# Patient Record
Sex: Female | Born: 1985 | Race: White | Hispanic: No | Marital: Single | Smoking: Current every day smoker
Health system: Southern US, Community
[De-identification: ages and names within clinical notes are randomized; demographics above are authoritative.]

## PROBLEM LIST (undated history)

## (undated) DIAGNOSIS — Z789 Other specified health status: Secondary | ICD-10-CM

## (undated) HISTORY — PX: NO PAST SURGERIES: SHX2092

---
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
## 2009-07-24 ENCOUNTER — Ambulatory Visit (HOSPITAL_COMMUNITY): Admission: RE | Admit: 2009-07-24 | Discharge: 2009-07-24 | Payer: Self-pay | Admitting: Unknown Physician Specialty

## 2009-08-21 ENCOUNTER — Ambulatory Visit (HOSPITAL_COMMUNITY): Admission: RE | Admit: 2009-08-21 | Discharge: 2009-08-21 | Payer: Self-pay | Admitting: Unknown Physician Specialty

## 2010-12-08 ENCOUNTER — Encounter: Payer: Self-pay | Admitting: Internal Medicine

## 2011-04-12 ENCOUNTER — Emergency Department (HOSPITAL_COMMUNITY): Payer: Self-pay

## 2011-04-12 ENCOUNTER — Emergency Department (HOSPITAL_COMMUNITY)
Admission: EM | Admit: 2011-04-12 | Discharge: 2011-04-12 | Disposition: A | Discharge status: Home / Self Care | Payer: Self-pay | Attending: Emergency Medicine | Admitting: Emergency Medicine

## 2011-04-12 DIAGNOSIS — F3289 Other specified depressive episodes: Secondary | ICD-10-CM | POA: Insufficient documentation

## 2011-04-12 DIAGNOSIS — G43909 Migraine, unspecified, not intractable, without status migrainosus: Secondary | ICD-10-CM | POA: Insufficient documentation

## 2011-04-12 DIAGNOSIS — R109 Unspecified abdominal pain: Secondary | ICD-10-CM | POA: Insufficient documentation

## 2011-04-12 DIAGNOSIS — O99340 Other mental disorders complicating pregnancy, unspecified trimester: Secondary | ICD-10-CM | POA: Insufficient documentation

## 2011-04-12 DIAGNOSIS — O99891 Other specified diseases and conditions complicating pregnancy: Secondary | ICD-10-CM | POA: Insufficient documentation

## 2011-04-12 DIAGNOSIS — F329 Major depressive disorder, single episode, unspecified: Secondary | ICD-10-CM | POA: Insufficient documentation

## 2011-04-12 LAB — DIFFERENTIAL
Basophils Absolute: 0 10*3/uL (ref 0.0–0.1)
Eosinophils Relative: 0 % (ref 0–5)
Lymphocytes Relative: 24 % (ref 12–46)
Lymphs Abs: 2 10*3/uL (ref 0.7–4.0)
Neutro Abs: 5.8 10*3/uL (ref 1.7–7.7)
Neutrophils Relative %: 70 % (ref 43–77)

## 2011-04-12 LAB — CBC
HCT: 33.4 % — ABNORMAL LOW (ref 36.0–46.0)
Hemoglobin: 11.6 g/dL — ABNORMAL LOW (ref 12.0–15.0)
MCH: 29.5 pg (ref 26.0–34.0)
MCHC: 34.7 g/dL (ref 30.0–36.0)
MCV: 85 fL (ref 78.0–100.0)
RBC: 3.93 MIL/uL (ref 3.87–5.11)

## 2011-04-12 LAB — BASIC METABOLIC PANEL
BUN: 7 mg/dL (ref 6–23)
CO2: 26 mEq/L (ref 19–32)
Creatinine, Ser: 0.53 mg/dL (ref 0.4–1.2)
Glucose, Bld: 80 mg/dL (ref 70–99)
Potassium: 3.5 mEq/L (ref 3.5–5.1)
Sodium: 136 mEq/L (ref 135–145)

## 2011-04-12 LAB — URINALYSIS, ROUTINE W REFLEX MICROSCOPIC: pH: 6.5 (ref 5.0–8.0)

## 2011-04-12 LAB — HCG, QUANTITATIVE, PREGNANCY: hCG, Beta Chain, Quant, S: 125388 m[IU]/mL — ABNORMAL HIGH (ref <5)

## 2011-04-13 IMAGING — US US OB DETAIL+14 WK
1 series · 14 of 28 positions shown · non-contrast
Comparison: none

OBSTETRICAL ULTRASOUND:
 This ultrasound was performed in The [HOSPITAL], and the [facility] report will be stored to [REDACTED] PACS.

[Series 1: us ob detail+14 wk · 14 of 90 slices shown]
[im 4/90]
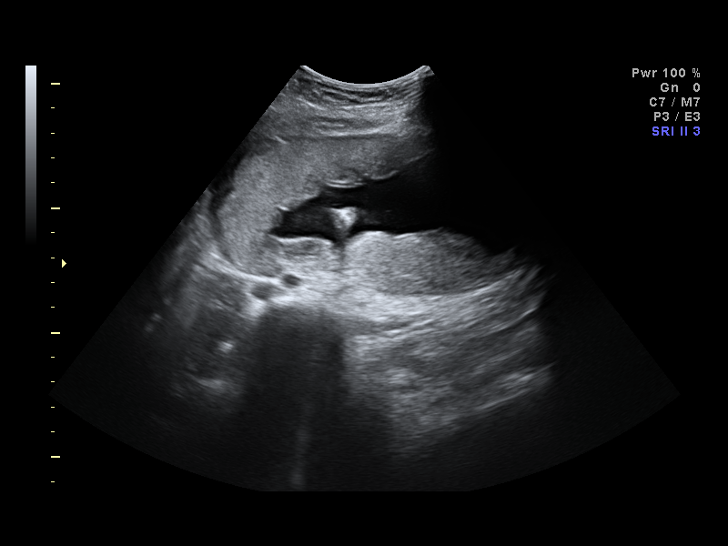
[im 10/90]
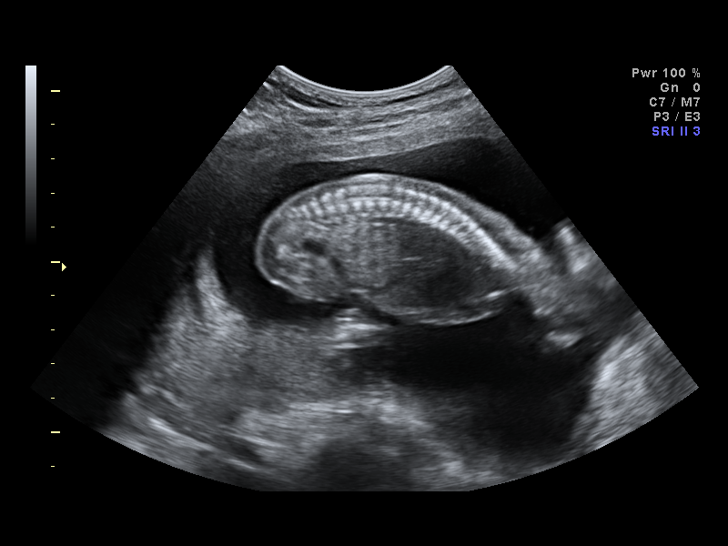
[im 17/90]
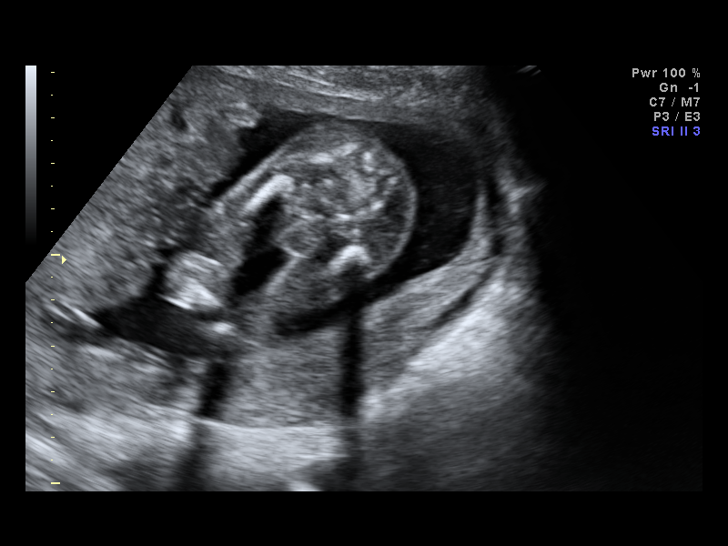
[im 24/90]
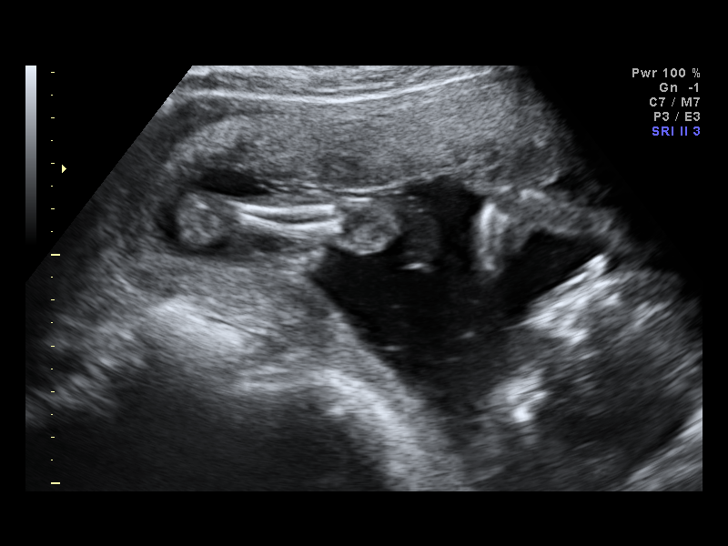
[im 30/90]
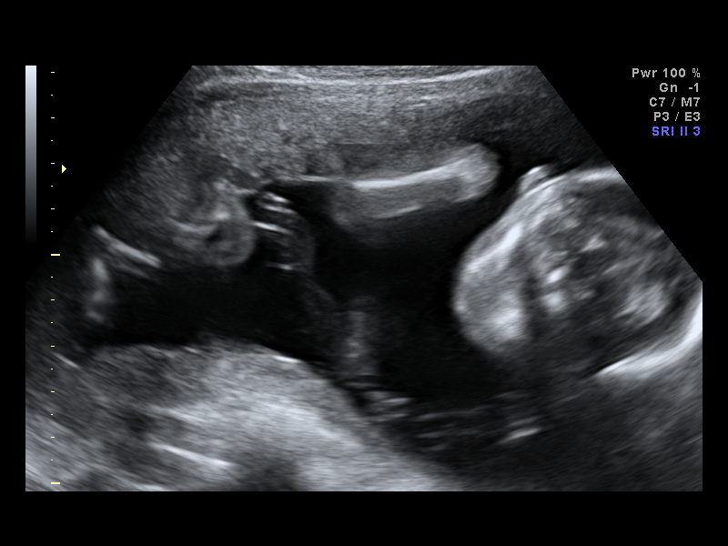
[im 37/90]
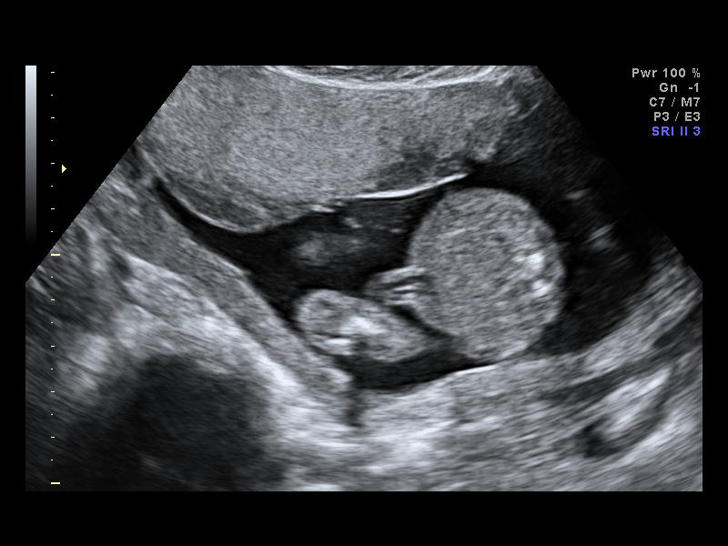
[im 43/90]
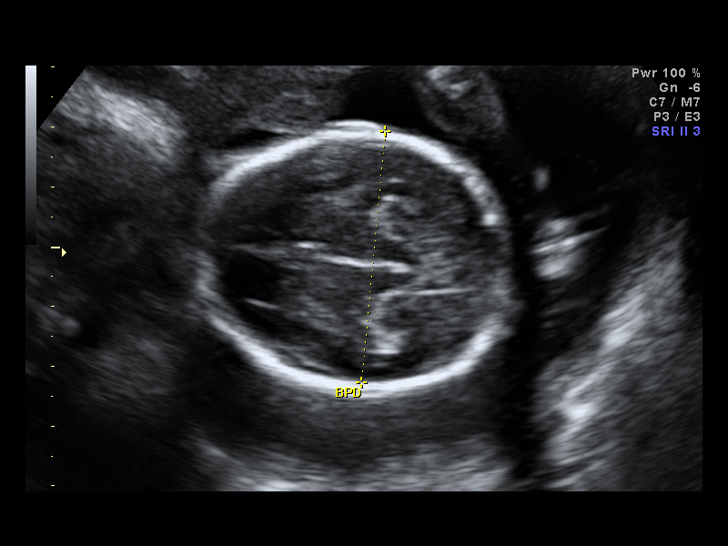
[im 50/90]
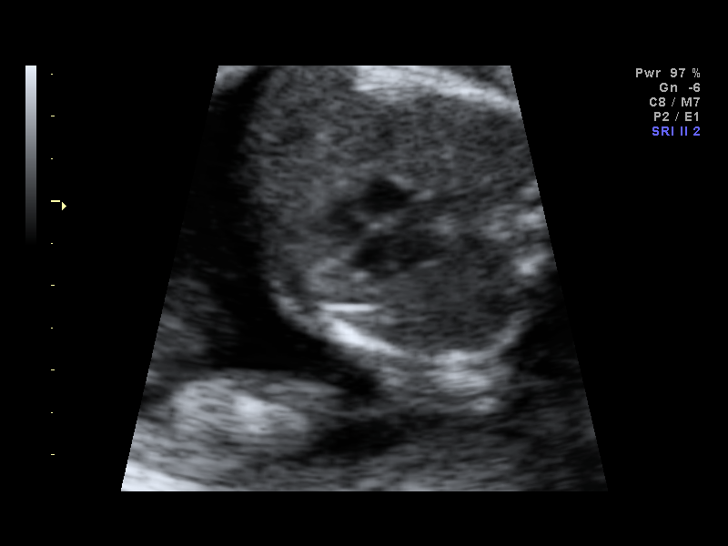
[im 57/90]
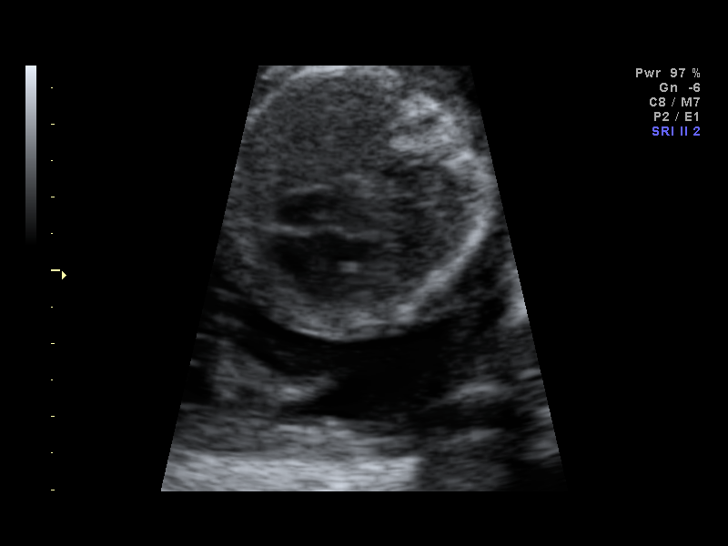
[im 63/90]
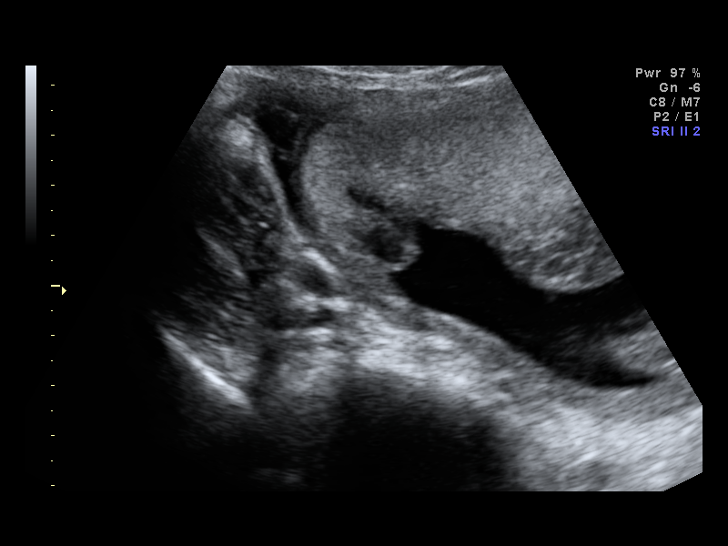
[im 70/90]
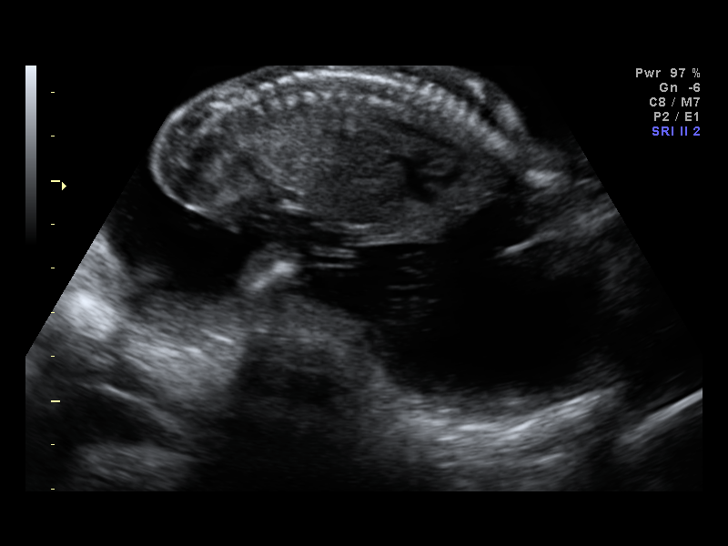
[im 76/90]
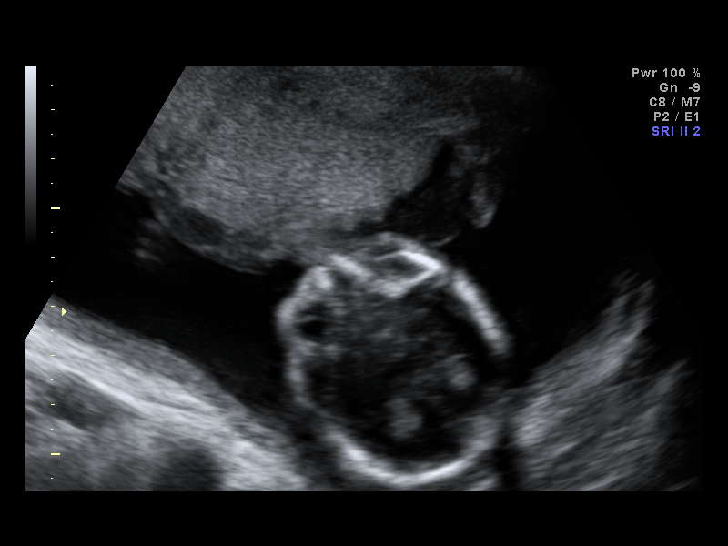
[im 83/90]
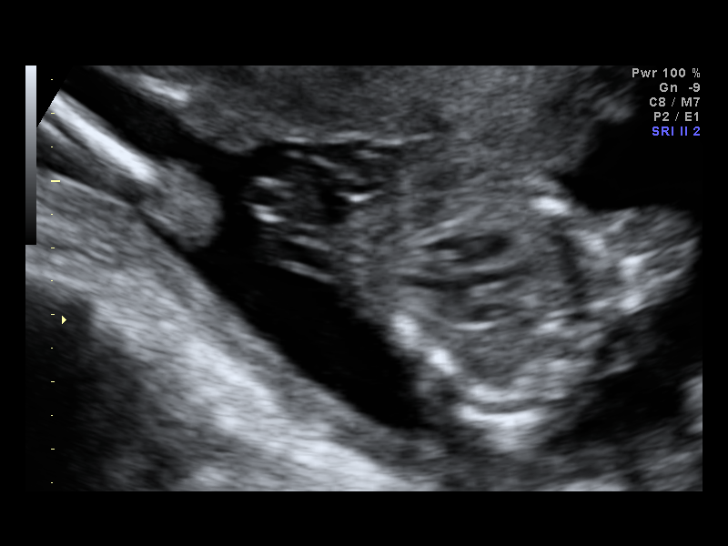
[im 90/90]
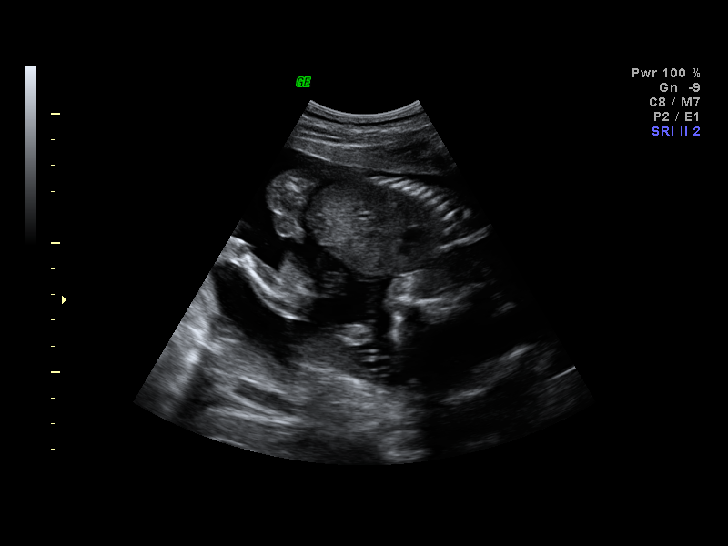

[14 of 28 positions shown; findings below may reference images not displayed]

IMPRESSION: [facility] has also been faxed to the ordering physician.

## 2011-04-15 LAB — GC/CHLAMYDIA PROBE AMP, GENITAL: GC Probe Amp, Genital: NEGATIVE

## 2011-12-10 ENCOUNTER — Ambulatory Visit (HOSPITAL_COMMUNITY)
Admission: RE | Admit: 2011-12-10 | Discharge: 2011-12-10 | Disposition: A | Discharge status: Home / Self Care | Payer: Medicaid Other | Source: Ambulatory Visit | Attending: Physician Assistant | Admitting: Physician Assistant

## 2011-12-10 ENCOUNTER — Other Ambulatory Visit (HOSPITAL_COMMUNITY): Payer: Self-pay | Admitting: Physician Assistant

## 2011-12-10 DIAGNOSIS — M542 Cervicalgia: Secondary | ICD-10-CM | POA: Insufficient documentation

## 2011-12-10 DIAGNOSIS — M549 Dorsalgia, unspecified: Secondary | ICD-10-CM

## 2011-12-10 DIAGNOSIS — M546 Pain in thoracic spine: Secondary | ICD-10-CM | POA: Insufficient documentation

## 2011-12-10 DIAGNOSIS — M545 Low back pain, unspecified: Secondary | ICD-10-CM | POA: Insufficient documentation

## 2012-01-13 ENCOUNTER — Other Ambulatory Visit (HOSPITAL_COMMUNITY): Payer: Self-pay | Admitting: Internal Medicine

## 2012-01-13 DIAGNOSIS — M549 Dorsalgia, unspecified: Secondary | ICD-10-CM

## 2012-01-15 ENCOUNTER — Ambulatory Visit (HOSPITAL_COMMUNITY)
Admission: RE | Admit: 2012-01-15 | Discharge: 2012-01-15 | Disposition: A | Discharge status: Home / Self Care | Payer: Medicaid Other | Source: Ambulatory Visit | Attending: Internal Medicine | Admitting: Internal Medicine

## 2012-01-15 DIAGNOSIS — M549 Dorsalgia, unspecified: Secondary | ICD-10-CM

## 2012-01-15 DIAGNOSIS — M25519 Pain in unspecified shoulder: Secondary | ICD-10-CM | POA: Insufficient documentation

## 2012-01-15 DIAGNOSIS — M545 Low back pain, unspecified: Secondary | ICD-10-CM | POA: Insufficient documentation

## 2012-01-15 DIAGNOSIS — M25559 Pain in unspecified hip: Secondary | ICD-10-CM | POA: Insufficient documentation

## 2012-01-15 DIAGNOSIS — M546 Pain in thoracic spine: Secondary | ICD-10-CM | POA: Insufficient documentation

## 2014-08-20 ENCOUNTER — Emergency Department (HOSPITAL_COMMUNITY): Payer: Medicaid Other

## 2014-08-20 ENCOUNTER — Inpatient Hospital Stay (HOSPITAL_COMMUNITY)
Admission: EM | Admit: 2014-08-20 | Discharge: 2014-08-20 | DRG: 603 | Discharge status: Against Medical Advice | Payer: Medicaid Other | Attending: Family Medicine | Admitting: Family Medicine

## 2014-08-20 ENCOUNTER — Encounter (HOSPITAL_COMMUNITY): Payer: Self-pay | Admitting: Emergency Medicine

## 2014-08-20 DIAGNOSIS — Z7982 Long term (current) use of aspirin: Secondary | ICD-10-CM

## 2014-08-20 DIAGNOSIS — L03211 Cellulitis of face: Principal | ICD-10-CM | POA: Diagnosis present

## 2014-08-20 DIAGNOSIS — K047 Periapical abscess without sinus: Secondary | ICD-10-CM | POA: Diagnosis present

## 2014-08-20 DIAGNOSIS — F17200 Nicotine dependence, unspecified, uncomplicated: Secondary | ICD-10-CM | POA: Diagnosis present

## 2014-08-20 DIAGNOSIS — K088 Other specified disorders of teeth and supporting structures: Secondary | ICD-10-CM

## 2014-08-20 DIAGNOSIS — K0889 Other specified disorders of teeth and supporting structures: Secondary | ICD-10-CM

## 2014-08-20 DIAGNOSIS — R6510 Systemic inflammatory response syndrome (SIRS) of non-infectious origin without acute organ dysfunction: Secondary | ICD-10-CM

## 2014-08-20 DIAGNOSIS — K1379 Other lesions of oral mucosa: Secondary | ICD-10-CM | POA: Diagnosis not present

## 2014-08-20 HISTORY — DX: Other specified health status: Z78.9

## 2014-08-20 LAB — CBC WITH DIFFERENTIAL/PLATELET
Basophils Absolute: 0 10*3/uL (ref 0.0–0.1)
Basophils Relative: 0 % (ref 0–1)
Eosinophils Absolute: 0.1 10*3/uL (ref 0.0–0.7)
Eosinophils Relative: 1 % (ref 0–5)
HCT: 39.7 % (ref 36.0–46.0)
HEMOGLOBIN: 13.3 g/dL (ref 12.0–15.0)
LYMPHS ABS: 1.8 10*3/uL (ref 0.7–4.0)
LYMPHS PCT: 14 % (ref 12–46)
MCH: 28.9 pg (ref 26.0–34.0)
MCHC: 33.5 g/dL (ref 30.0–36.0)
MCV: 86.3 fL (ref 78.0–100.0)
MONOS PCT: 7 % (ref 3–12)
Monocytes Absolute: 0.9 10*3/uL (ref 0.1–1.0)
NEUTROS PCT: 78 % — AB (ref 43–77)
Neutro Abs: 9.9 10*3/uL — ABNORMAL HIGH (ref 1.7–7.7)
PLATELETS: 247 10*3/uL (ref 150–400)
RBC: 4.6 MIL/uL (ref 3.87–5.11)
RDW: 12.7 % (ref 11.5–15.5)
WBC: 12.7 10*3/uL — AB (ref 4.0–10.5)

## 2014-08-20 LAB — BASIC METABOLIC PANEL
Anion gap: 9 (ref 5–15)
BUN: 9 mg/dL (ref 6–23)
CO2: 29 meq/L (ref 19–32)
Calcium: 9.1 mg/dL (ref 8.4–10.5)
Chloride: 99 mEq/L (ref 96–112)
Creatinine, Ser: 0.72 mg/dL (ref 0.50–1.10)
GFR calc Af Amer: >90 mL/min (ref >90)
GFR calc non Af Amer: >90 mL/min (ref >90)
GLUCOSE: 91 mg/dL (ref 70–99)
POTASSIUM: 3.8 meq/L (ref 3.7–5.3)
SODIUM: 137 meq/L (ref 137–147)

## 2014-08-20 LAB — POC URINE PREG, ED: PREG TEST UR: NEGATIVE

## 2014-08-20 MED ORDER — KETOROLAC TROMETHAMINE 30 MG/ML IJ SOLN: 30.0000 mg | Freq: Once | INTRAMUSCULAR | Status: DC

## 2014-08-20 MED ORDER — SODIUM CHLORIDE 0.9 % IJ SOLN: 3.0000 mL | Freq: Two times a day (BID) | INTRAMUSCULAR | Status: DC

## 2014-08-20 MED ORDER — IOHEXOL 300 MG/ML  SOLN
80.0000 mL | Freq: Once | INTRAMUSCULAR | Status: AC | PRN
  Administered 2014-08-20: 80 mL via INTRAVENOUS

## 2014-08-20 MED ORDER — ONDANSETRON HCL 4 MG/2ML IJ SOLN
4.0000 mg | Freq: Four times a day (QID) | INTRAMUSCULAR | Status: DC | PRN
Start: 2014-08-20 — End: 2014-08-21

## 2014-08-20 MED ORDER — CLINDAMYCIN PHOSPHATE 600 MG/50ML IV SOLN
600.0000 mg | Freq: Once | INTRAVENOUS | Status: AC
  Administered 2014-08-20: 600 mg via INTRAVENOUS
  Filled 2014-08-20: qty 50

## 2014-08-20 MED ORDER — HYDROCODONE-ACETAMINOPHEN 5-325 MG PO TABS: 1.0000 | ORAL_TABLET | ORAL | Status: DC | PRN

## 2014-08-20 MED ORDER — CLINDAMYCIN PHOSPHATE 600 MG/50ML IV SOLN
INTRAVENOUS | Status: AC
  Filled 2014-08-20: qty 50

## 2014-08-20 MED ORDER — ONDANSETRON HCL 4 MG PO TABS: 4.0000 mg | ORAL_TABLET | Freq: Four times a day (QID) | ORAL | Status: DC | PRN

## 2014-08-20 MED ORDER — SODIUM CHLORIDE 0.9 % IV SOLN: 250.0000 mL | INTRAVENOUS | Status: DC | PRN

## 2014-08-20 MED ORDER — HYDROMORPHONE HCL 1 MG/ML IJ SOLN
0.5000 mg | Freq: Once | INTRAMUSCULAR | Status: AC
  Administered 2014-08-20: 0.5 mg via INTRAVENOUS
  Filled 2014-08-20: qty 1

## 2014-08-20 MED ORDER — CLINDAMYCIN PHOSPHATE 600 MG/50ML IV SOLN
600.0000 mg | Freq: Four times a day (QID) | INTRAVENOUS | Status: DC
  Administered 2014-08-20: 600 mg via INTRAVENOUS
  Filled 2014-08-20 (×4): qty 50

## 2014-08-20 MED ORDER — MORPHINE SULFATE 2 MG/ML IJ SOLN: 2.0000 mg | INTRAMUSCULAR | Status: DC | PRN

## 2014-08-20 MED ORDER — ACETAMINOPHEN 325 MG PO TABS: 650.0000 mg | ORAL_TABLET | Freq: Four times a day (QID) | ORAL | Status: DC | PRN

## 2014-08-20 MED ORDER — SODIUM CHLORIDE 0.9 % IV SOLN
1000.0000 mL | Freq: Once | INTRAVENOUS | Status: AC
  Administered 2014-08-20: 1000 mL via INTRAVENOUS

## 2014-08-20 MED ORDER — HYDROMORPHONE HCL 1 MG/ML IJ SOLN
1.0000 mg | Freq: Once | INTRAMUSCULAR | Status: AC
  Administered 2014-08-20: 1 mg via INTRAVENOUS
  Filled 2014-08-20: qty 1

## 2014-08-20 MED ORDER — SODIUM CHLORIDE 0.9 % IJ SOLN: 3.0000 mL | INTRAMUSCULAR | Status: DC | PRN

## 2014-08-20 MED ORDER — ACETAMINOPHEN 650 MG RE SUPP: 650.0000 mg | Freq: Four times a day (QID) | RECTAL | Status: DC | PRN

## 2014-08-20 MED ORDER — ACETAMINOPHEN 325 MG PO TABS
650.0000 mg | ORAL_TABLET | Freq: Once | ORAL | Status: AC
  Administered 2014-08-20: 650 mg via ORAL
  Filled 2014-08-20: qty 2

## 2014-08-20 MED ORDER — KETOROLAC TROMETHAMINE 30 MG/ML IJ SOLN
15.0000 mg | Freq: Once | INTRAMUSCULAR | Status: AC
  Administered 2014-08-20: 15 mg via INTRAVENOUS
  Filled 2014-08-20: qty 1

## 2014-08-20 NOTE — Progress Notes (Signed)
Antibiotic completed. Pt again informed of risks for leaving AMA. Pt refusing to stay. AMA papers signed, IV removed. Mother with pt. Pt refused wheelchair, & walked out when left.

## 2014-08-20 NOTE — ED Notes (Signed)
Dental pain to left side of mouth.

## 2014-08-20 NOTE — ED Provider Notes (Signed)
CSN: [id number]     Arrival date & time 08/20/14  1046 History  This chart was scribed for Enid Skeens, MD, by Yevette Wolter, ED Scribe. This patient was seen in room APA09/APA09 and the patient's care was started at 12:03 PM.   First MD Initiated Contact with Patient 08/20/14 1155     Chief Complaint  Patient presents with  . Oral Swelling   The history is provided by the patient.   HPI Comments: Brandi Hayes is a 28 y.o. female who presents to the Emergency Department complaining of left-sided, lower facial swelling which was present upon awakening this morning. The swelling is associated with pain. The pt also presents with a fever; her temperature in the ED is 100.5 F.  She denies a h/o similar symptoms. However, she reports a tooth to the affected side has been cracked for multiple months and she has been treating the tooth with temporary fillings purchased OTC. The pt denies a h/o dental abscesses or MRSA. Additionally, she denies a cough, nausea, emesis, difficulty swallowing, or SOB.   History reviewed. No pertinent past medical history. History reviewed. No pertinent past surgical history. No family history on file. History  Substance Use Topics  . Smoking status: Current Every Day Smoker  . Smokeless tobacco: Not on file  . Alcohol Use: Not on file   No OB history provided.  Review of Systems  Constitutional: Negative for fever and chills.  HENT: Positive for dental problem and facial swelling. Negative for congestion.   Eyes: Negative for visual disturbance.  Respiratory: Negative for shortness of breath.   Cardiovascular: Negative for chest pain.  Gastrointestinal: Negative for vomiting and abdominal pain.  Genitourinary: Negative for dysuria and flank pain.  Musculoskeletal: Negative for back pain, neck pain and neck stiffness.  Skin: Negative for rash.  Neurological: Negative for light-headedness and headaches.    A complete 10 system review of systems was  obtained, and all systems were negative except where indicated in the HPI and PE.    Allergies  Amoxicillin; Penicillins; and Sulfa antibiotics  Home Medications   Prior to Admission medications   Not on File   Triage Vitals: BP 111/81  Pulse 111  Temp(Src) 100.5 F (38.1 C) (Oral)  Resp 20  Ht 5\' 5"  (1.651 m)  Wt 110 lb (49.896 kg)  BMI 18.31 kg/m2  SpO2 98%  LMP 08/13/2014  Physical Exam  Nursing note and vitals reviewed. Constitutional: She is oriented to person, place, and time. She appears well-developed and well-nourished. No distress.  Pt obviously in pain.   HENT:  Temporary filling to left lower first premolar. Significant swelling and pain on the inner and outer mucosal, lower left side. Significant swelling to submandibular on left side. No swelling of right submandibular. No induration or swelling under the tongue. Neck supple, low concern for Ludwigs  Eyes: Conjunctivae and EOM are normal.  Neck: Neck supple. No tracheal deviation present.  Adenopathy left anterior cervical.   Cardiovascular: Regular rhythm.   Tachycardic.   Pulmonary/Chest: Effort normal and breath sounds normal. No respiratory distress. She has no wheezes.  Abdominal: Soft. There is no tenderness.  Musculoskeletal: Normal range of motion. She exhibits edema.  Lymphadenopathy:    She has cervical adenopathy.  Neurological: She is alert and oriented to person, place, and time.  Skin: Skin is warm and dry.  Psychiatric: She has a normal mood and affect. Her behavior is normal.    ED Course  Procedures (including  critical care time)  DIAGNOSTIC STUDIES: Oxygen Saturation is 98% on room air, normal by my interpretation.    COORDINATION OF CARE:  12:10 PM- Discussed treatment plan with patient, and the patient agreed to the plan.   Labs Review Labs Reviewed  CBC WITH DIFFERENTIAL - Abnormal; Notable for the following:    WBC 12.7 (*)    Neutrophils Relative % 78 (*)    Neutro Abs 9.9  (*)    All other components within normal limits  BASIC METABOLIC PANEL  POC URINE PREG, ED    Imaging Review Ct Soft Tissue Neck W Contrast  08/20/2014   CLINICAL DATA:  Left-sided lower facial pain and swelling present upon awakening this morning; low-grade fever semi: Chronic cracked tooth in this region being treating with over the counter temporary fillings ; initial visit  EXAM: CT NECK WITH CONTRAST  TECHNIQUE: Multidetector CT imaging of the neck was performed using the standard protocol following the bolus administration of intravenous contrast.  CONTRAST:  80mL OMNIPAQUE IOHEXOL 300 MG/ML  SOLN  COMPARISON:  None.  FINDINGS: There is considerable soft tissue swelling over the left aspect of the jawextending superiorly into the lower malar region. There is no discrete drainable abscess. The greatest degree of inflammation is associated with the left aspect of the mandible mandible. No periosteal reaction is demonstrated nor is there cortical disruption. There is lucency within the mandible adjacent to 2 lateral teeth, 1 of which contains a metallic filling. The maxilla is intact.  There is fluid within the maxillary, ethmoid, and sphenoid sinuses consistent with acute sinusitis. There is fluid within the nasal passages. The mastoid air cells are well pneumatized. The bony orbits are intact where visualized. The bony walls of the paranasal sinuses are intact. The temporomandibular joints are normal. The pterygoid plates are intact.  There is an enlarged right anterior cervical lymph node measuring 12 x 16 mm. There are other normal size to borderline enlarged cervical lymph nodes. The parotid and submandibular glands are normal. The larynx and thyroid gland are unremarkable. The pulmonary apices are clear. The cervical spine is unremarkable.  IMPRESSION: 1. Findings consistent with cellulitis of the soft tissues of the left aspect of the jaw extending into the lower malar region. The inflammatory  changes are most conspicuous along the left aspect of the mandible and may be related underlying the periodontal disease. There is no discrete drainable abscess. 2. There is acute sinusitis of the visualized paranasal sinuses with multiple air-fluid levels. This too could be related to the patient's symptoms. 3. There is no evidence of osteomyelitis.   Electronically Signed   By: David  [place]   On: 08/20/2014 13:56     EKG Interpretation None      MDM   Final diagnoses:  SIRS (systemic inflammatory response syndrome)  Facial cellulitis  Pain, dental  Sinusitis I personally performed the services described in this documentation, which was scribed in my presence. The recorded information has been reviewed and is accurate.  Patient with significant left lower facial mandibular swelling concern for cellulitis with possible abscess. Pain meds given mild improvement on recheck of her pain worsened, repeat pain medicines ordered. Blood work showed mild leukocytosis, patient low-grade fever and tachycardia. CT scan results reviewed no focal abscess to drain at this time, cellulitis and sinusitis.  IV fluid bolus given. Discussed with [facility] physician for consult and likely admission for IV antibiotics and pain control.  [facility] final recommendation pending.  Filed Vitals:  08/20/14 1430  BP: 106/87  Pulse: 106  Temp:   Resp:      Enid Skeens, MD 08/20/14 [phone]

## 2014-08-20 NOTE — H&P (Signed)
History and Physical  Brandi Hayes ZOX:[id number] DOB: August 19, 1986 DOA: 08/20/2014  Referring physician: Dr. Jodi Mourning in ED PCP: Cassell Smiles., MD   Chief Complaint: left mouth pain  HPI:  28 year old woman with h/o left tooth pain self-treated developed rapid swelling left side of face with pain. CT revealed cellulitis without abscess, associated with dental infection. Given prominent swelling, admitted for IV abx and pain control.  Patient reports left lower premolar pain for months or more, self-treated with OTC "fillings" from [employer]. Her dentist doesn't accept Medicaid and she has not seen a dentist in years.  48 hours ago she developed increased tooth and left jaw pain. Over last night had marked swelling and pain along left jaw. No fever. No dysphagia, no difficulty breathing, no neck pain, no drooling. No difficulty speaking, voice normal. No right sided pain or swelling.  In the emergency department afebrile, VSS, no hypoxia. WBC 12.7, BMP unremarkable, urine pregnancy negative. CT soft tissue neck showed cellulitis soft tissue left aspect of jaw related to underlying periodontal disease. No abscess.   Review of Systems:  Negative for fever, visual changes, sore throat, rash, new muscle aches, chest pain, SOB, dysuria, bleeding, n/v/abdominal pain.  Past Medical History  Diagnosis Date  . Medical history non-contributory   no past medical problems  Past Surgical History  Procedure Laterality Date  . No past surgeries      Social History:  reports that she has been smoking.  She does not have any smokeless tobacco history on file. She reports that she does not drink alcohol or use illicit drugs.  Allergies  Allergen Reactions  . Amoxicillin Hives  . Penicillins Hives  . Sulfa Antibiotics Hives    History reviewed. No pertinent family history. Mother with history of dental abscesses  Prior to Admission medications   Medication Sig Start Date End Date Taking?  Authorizing Provider  acetaminophen (TYLENOL) 500 MG tablet Take 1,000 mg by mouth every 6 (six) hours as needed (dental pain).   Yes Historical Provider, MD  ALPRAZolam (XANAX) 0.25 MG tablet Take 0.25 mg by mouth 2 (two) times daily as needed for anxiety.   Yes Historical Provider, MD  aspirin 81 MG chewable tablet Chew 162 mg by mouth daily as needed (dental pain).   Yes Historical Provider, MD  ibuprofen (ADVIL,MOTRIN) 200 MG tablet Take 800 mg by mouth every 6 (six) hours as needed (dental pain).   Yes Historical Provider, MD  oxyCODONE-acetaminophen (PERCOCET/ROXICET) 5-325 MG per tablet Take 1 tablet by mouth every 4 (four) hours as needed for severe pain.   Yes Historical Provider, MD   Physical Exam: Filed Vitals:   08/20/14 1500 08/20/14 1550 08/20/14 1619 08/20/14 1624  BP: 104/70  95/55   Pulse: 110  93   Temp:  98.2 F (36.8 C) 99 F (37.2 C)   TempSrc:  Oral Oral   Resp:      Height:   5\' 5"  (1.651 m)   Weight:    49.215 kg (108 lb 8 oz)  SpO2: 100%  100%    General: examined in ED. Appears anxious, in pain, non-toxic Eyes: PERRL, normal lids, irises  ENT: grossly normal hearing, lips & tongue. Right buccal mucosa unremarkable as are gums. Area below tongue appears normal without edema, O-P clear. Left lower dentition poor, filling noted. Buccal mucosa appears edematous but not erythematous. There is significant left submandibular swelling, induration and pain with palpation. There is no swelling of right submandibular area. Neck: moves easily  Cardiovascular: RRR, no m/r/g. No LE edema. Respiratory: CTA bilaterally, no w/r/r. Normal respiratory effort. Abdomen: soft, ntnd Skin: no rash or induration seen except as about Musculoskeletal: grossly normal tone BUE/BLE Psychiatric: grossly normal mood and affect, speech fluent and appropriate Neurologic: grossly non-focal.  Wt Readings from Last 3 Encounters:  08/20/14 49.215 kg (108 lb 8 oz)    Labs on Admission:    Basic Metabolic Panel:  Recent Labs Lab 08/20/14 1218  NA 137  K 3.8  CL 99  CO2 29  GLUCOSE 91  BUN 9  CREATININE 0.72  CALCIUM 9.1    CBC:  Recent Labs Lab 08/20/14 1218  WBC 12.7*  NEUTROABS 9.9*  HGB 13.3  HCT 39.7  MCV 86.3  PLT 247    Radiological Exams on Admission: Ct Soft Tissue Neck W Contrast  08/20/2014   CLINICAL DATA:  Left-sided lower facial pain and swelling present upon awakening this morning; low-grade fever semi: Chronic cracked tooth in this region being treating with over the counter temporary fillings ; initial visit  EXAM: CT NECK WITH CONTRAST  TECHNIQUE: Multidetector CT imaging of the neck was performed using the standard protocol following the bolus administration of intravenous contrast.  CONTRAST:  80mL OMNIPAQUE IOHEXOL 300 MG/ML  SOLN  COMPARISON:  None.  FINDINGS: There is considerable soft tissue swelling over the left aspect of the jawextending superiorly into the lower malar region. There is no discrete drainable abscess. The greatest degree of inflammation is associated with the left aspect of the mandible mandible. No periosteal reaction is demonstrated nor is there cortical disruption. There is lucency within the mandible adjacent to 2 lateral teeth, 1 of which contains a metallic filling. The maxilla is intact.  There is fluid within the maxillary, ethmoid, and sphenoid sinuses consistent with acute sinusitis. There is fluid within the nasal passages. The mastoid air cells are well pneumatized. The bony orbits are intact where visualized. The bony walls of the paranasal sinuses are intact. The temporomandibular joints are normal. The pterygoid plates are intact.  There is an enlarged right anterior cervical lymph node measuring 12 x 16 mm. There are other normal size to borderline enlarged cervical lymph nodes. The parotid and submandibular glands are normal. The larynx and thyroid gland are unremarkable. The pulmonary apices are clear. The  cervical spine is unremarkable.  IMPRESSION: 1. Findings consistent with cellulitis of the soft tissues of the left aspect of the jaw extending into the lower malar region. The inflammatory changes are most conspicuous along the left aspect of the mandible and may be related underlying the periodontal disease. There is no discrete drainable abscess. 2. There is acute sinusitis of the visualized paranasal sinuses with multiple air-fluid levels. This too could be related to the patient's symptoms. 3. There is no evidence of osteomyelitis.   Electronically Signed   By: David  [place]   On: 08/20/2014 13:56     Active Problems:   Facial cellulitis   Assessment/Plan 1. Cellulitis left aspect of jaw secondary to dental infection, no abscess with h/o poor dentition. Afebrile, VSS, no features to suggest Ludwig's angina or deep space infection. CT suggested acute sinusitis but patient asymptomatic. 2. Pain secondary to above.   Plan admission for IV abx and pain control. Once induration has stabilized will plan discharge to dental clinic for definitive treatment. Discussed with patient and mother that this infection will not fully resolve without dental follow-up.  Code Status: full code  DVT prophylaxis: SCDs Family Communication:  as above Disposition Plan/Anticipated LOS: admit, 2 days  Time spent: 60 minutes  Brendia Sacksaniel Shabre Kreher, MD  [facility] Pager [phone] 08/20/2014, 5:06 PM

## 2014-08-20 NOTE — ED Notes (Signed)
Pt cracked a tooth about a years ago and has been buying "temporary fillings OTC" as the tooth was bothering her but she couldn't get in to see a dentist due to her being on medicaid.  Pt states that the temporary fillings would be changed about every few months and when she tried to change it Friday pm she didn't get any relief and began having severe dental pain and also swelling.  Pt presents with low grade temp, crying in pain and swelling to left side of her face and reports that she thinks the swelling is affecting her left arm (she states that it feels a little weak).  Pt is alert and oriented, no difficulty swallowing or sob.  Swelling to side of neck also per pt

## 2014-08-20 NOTE — Progress Notes (Signed)
Patient informed nurse that she did not want to stay overnight and wanted to go home.  Patient made aware that it would be better for her to stay overnight to get IV antibiotics due to the infection, Dr. Irene LimboGoodrich made aware, he spoke with the patient on the phone about her infection and how it could get worse without IV antibiotics, patient insistent on leaving, patient was informed she would have to sign out AMA, she agreed to take the 1900 dose of IV antibiotics then leave afterwards.

## 2014-08-20 NOTE — ED Notes (Signed)
MD at bedside. 

## 2014-08-21 ENCOUNTER — Encounter (HOSPITAL_COMMUNITY): Payer: Self-pay | Admitting: Emergency Medicine

## 2014-08-21 ENCOUNTER — Emergency Department (HOSPITAL_COMMUNITY)
Admission: EM | Admit: 2014-08-21 | Discharge: 2014-08-22 | Discharge status: Against Medical Advice | Payer: Medicaid Other | Attending: Emergency Medicine | Admitting: Emergency Medicine

## 2014-08-21 DIAGNOSIS — M272 Inflammatory conditions of jaws: Secondary | ICD-10-CM | POA: Insufficient documentation

## 2014-08-21 DIAGNOSIS — Z720 Tobacco use: Secondary | ICD-10-CM | POA: Insufficient documentation

## 2014-08-21 NOTE — Progress Notes (Signed)
UR chart review completed.  

## 2014-08-21 NOTE — ED Notes (Signed)
Called for room placement no answer 

## 2014-08-21 NOTE — ED Notes (Signed)
Dental abscess rt mandible, Seen here 10/4 and admitted . Signed out AMA, Went to  Dentist and told to return for  IV antibiotics.

## 2014-08-22 NOTE — Discharge Summary (Signed)
  Physician Discharge Summary  Brandi Hayes ZOX:[id number] DOB: Oct 13, 1986 DOA: 08/20/2014  PCP: Cassell SmilesFUSCO,LAWRENCE J., MD  Admit date: 08/20/2014 Discharge date: 08/20/2014 AMA  PATIENT LEFT AMA  Discharge Diagnoses:  1. Cellulitis left jaw secondary to dental infection without evidence of abscess  HPI Patient presented with dental infection and was admitted for IV antibiotics and pain control. Several hours after admission patient insisted on going home. My recommendation was to stay for IV antibiotics. I informed patient that pain, swelling, infection and condition could worsen and become more serious if she left; she clearly understood the risks but insisted on leaving. She reported she would seek dental evaluation 10/5 in the morning.   Active Problems:   Facial cellulitis   Dental infection  Signed:  Brendia Sacksaniel Goodrich, MD [facility] 08/22/2014, 9:43 AM

## 2014-08-22 NOTE — ED Notes (Signed)
Called for room placement no answer 

## 2016-05-09 IMAGING — CT CT NECK W/ CM
3 of 4 series · 13 of 33 positions shown, 16 images · IV contrast (Omnipaque 300)
Comparison: None.

CLINICAL DATA: Left-sided lower facial pain and swelling present
upon awakening this morning; low-grade fever semi: Chronic cracked
tooth in this region being treating with over the counter temporary
fillings ; initial visit

EXAM:
CT NECK WITH CONTRAST
TECHNIQUE: Multidetector CT imaging of the neck was performed using the
standard protocol following the bolus administration of intravenous
contrast.
CONTRAST:  80mL OMNIPAQUE IOHEXOL 300 MG/ML  SOLN

[Series 2: soft tissue neck 2.0 b31s · axial · 0.48mm/px · z∈[+42,+194]mm · 5 of 115 slices shown, 7 images]
[im 20/115  soft-tissue]
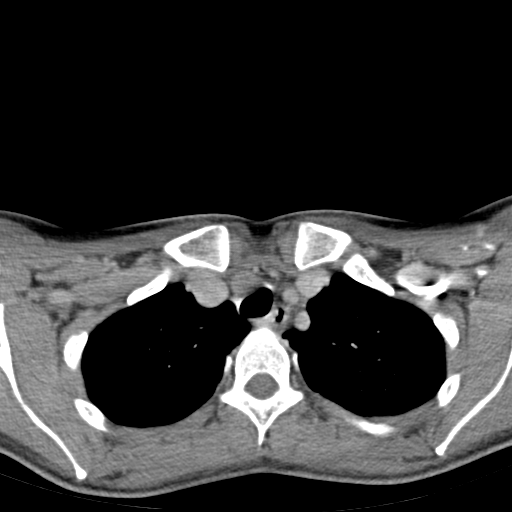
[im 20/115  bone]
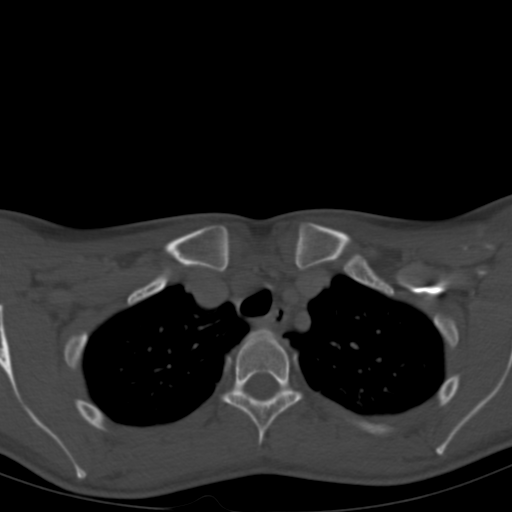
[im 39/115  bone]
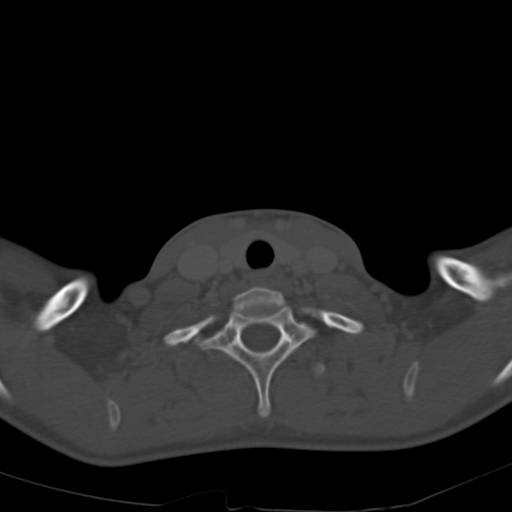
[im 58/115  bone]
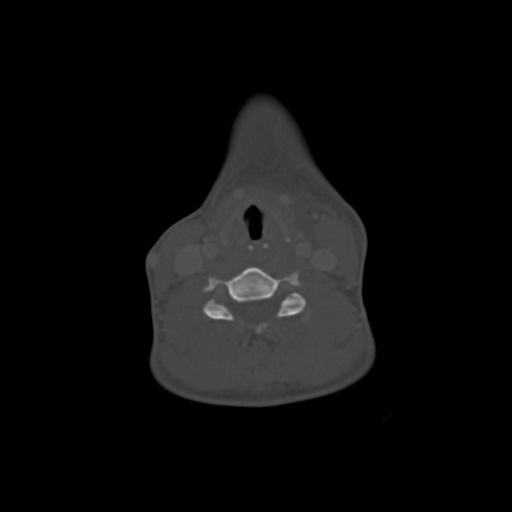
[im 77/115  bone]
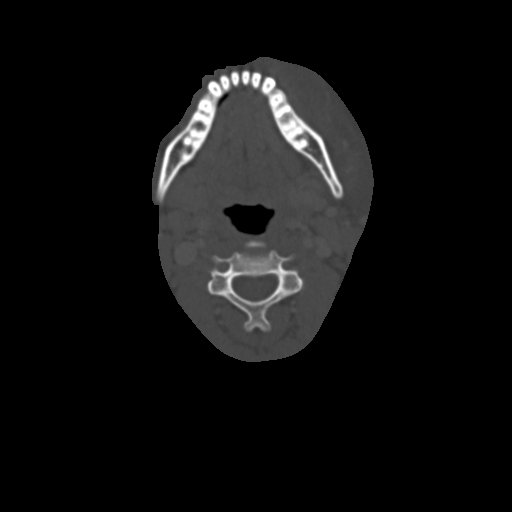
[im 96/115  soft-tissue]
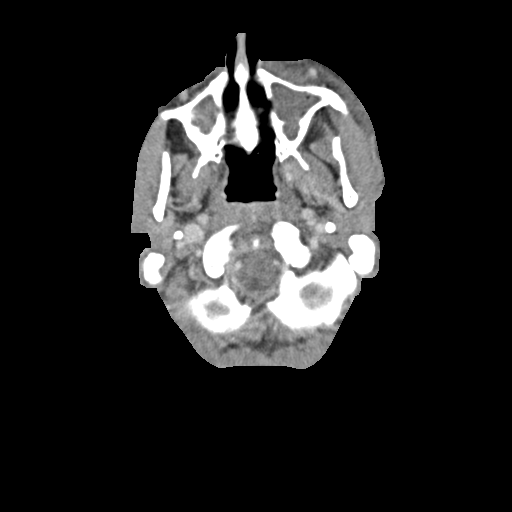
[im 96/115  bone]
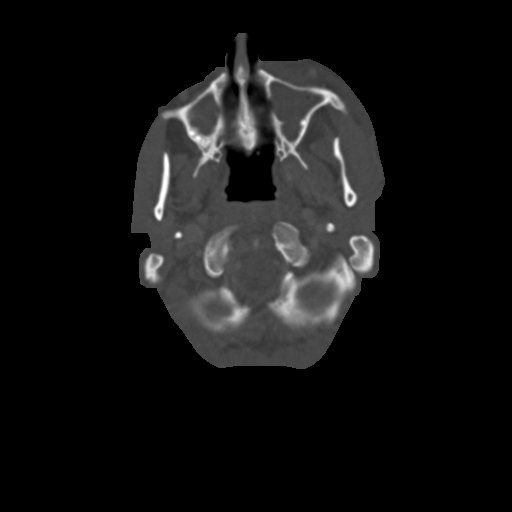

[Series 4: neck 2.0 soft tissue sag · sagittal · 0.45mm/px · 5 of 121 slices shown, 6 images]
[im 41/121  bone]
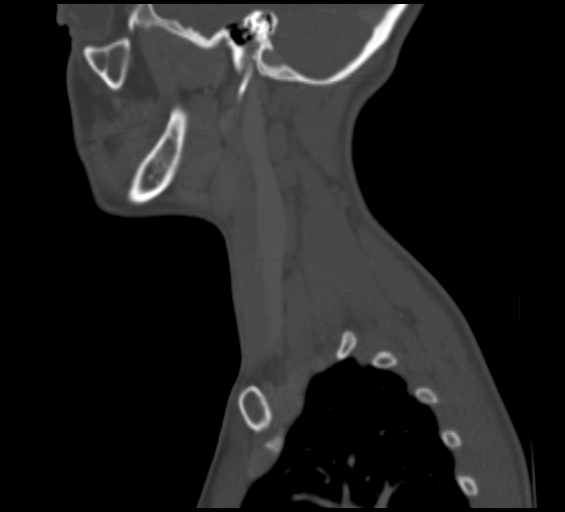
[im 51/121  bone]
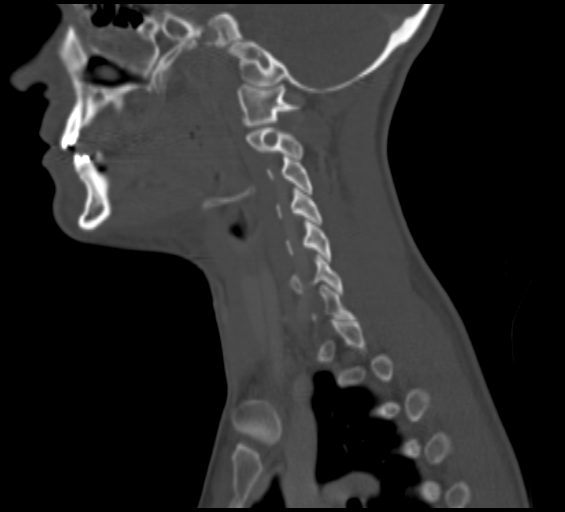
[im 61/121  soft-tissue]
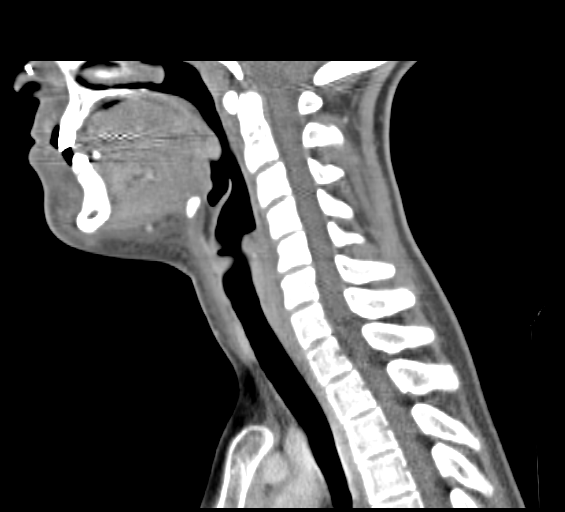
[im 61/121  bone]
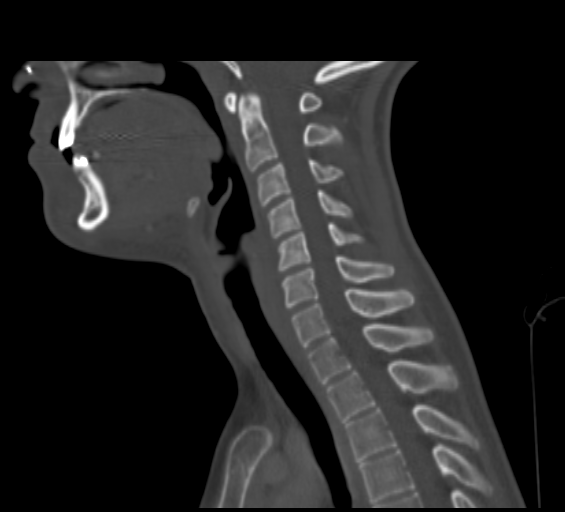
[im 71/121  bone]
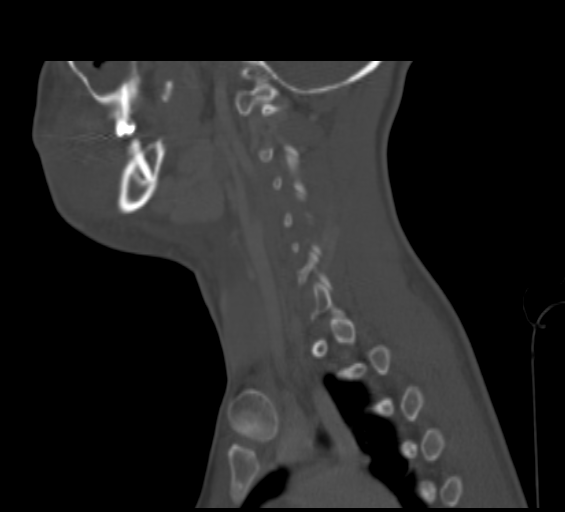
[im 81/121  bone]
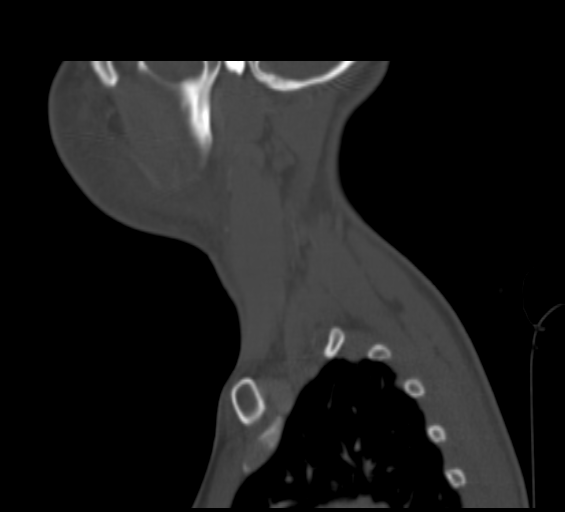

[Series 5: neck 2.0 soft tissue coro · coronal · 0.45mm/px · 3 of 117 slices shown]
[im 24/117  bone]
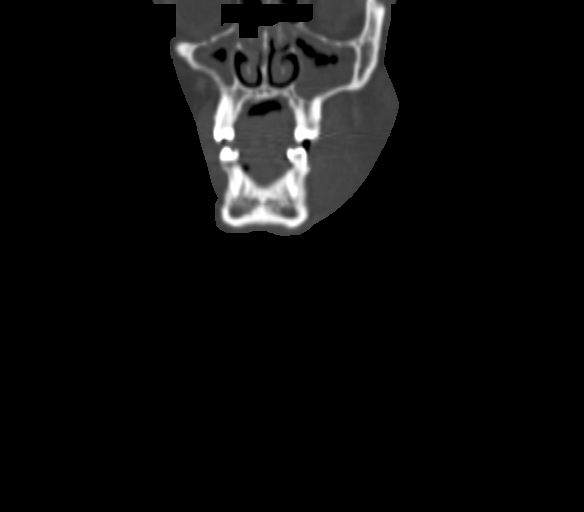
[im 47/117  bone]
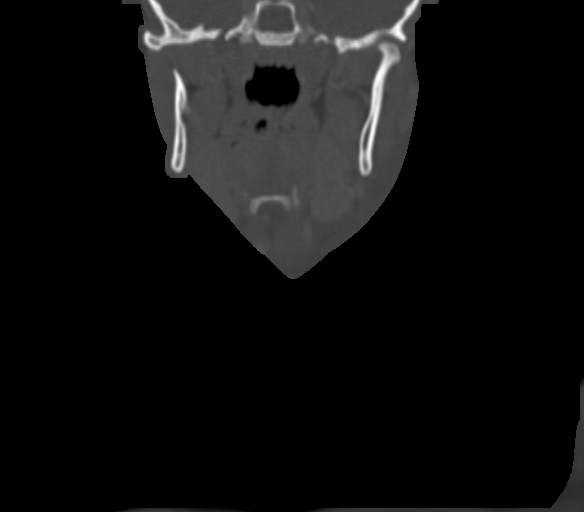
[im 70/117  bone]
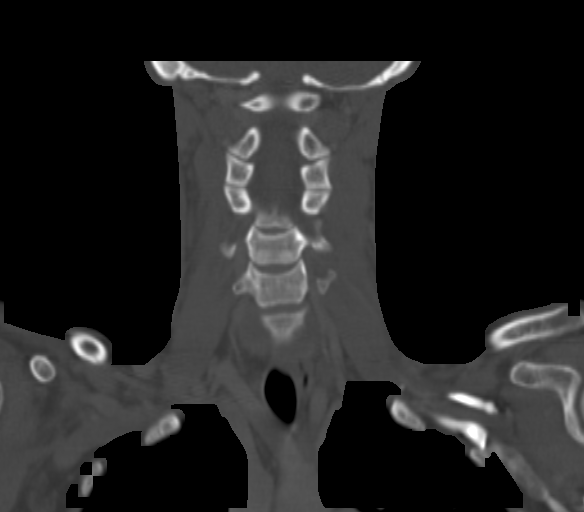

[13 of 33 positions shown; findings below may reference images not displayed]

FINDINGS: There is considerable soft tissue swelling over the left aspect of
the jawextending superiorly into the lower malar region. There is no
discrete drainable abscess. The greatest degree of inflammation is
associated with the left aspect of the mandible mandible. No
periosteal reaction is demonstrated nor is there cortical
disruption. There is lucency within the mandible adjacent to 2
lateral teeth, 1 of which contains a metallic filling. The maxilla
is intact.

There is fluid within the maxillary, ethmoid, and sphenoid sinuses
consistent with acute sinusitis. There is fluid within the nasal
passages. The mastoid air cells are well pneumatized. The bony
orbits are intact where visualized. The bony walls of the paranasal
sinuses are intact. The temporomandibular joints are normal. The
pterygoid plates are intact.

There is an enlarged right anterior cervical lymph node measuring 12
x 16 mm. There are other normal size to borderline enlarged cervical
lymph nodes. The parotid and submandibular glands are normal. The
larynx and thyroid gland are unremarkable. The pulmonary apices are
clear. The cervical spine is unremarkable.
IMPRESSION: 1. Findings consistent with cellulitis of the soft tissues of the
left aspect of the jaw extending into the lower malar region. The
inflammatory changes are most conspicuous along the left aspect of
the mandible and may be related underlying the periodontal disease.
There is no discrete drainable abscess.
2. There is acute sinusitis of the visualized paranasal sinuses with
multiple air-fluid levels. This too could be related to the
patient's symptoms.
3. There is no evidence of osteomyelitis.

## 2017-06-30 ENCOUNTER — Emergency Department (HOSPITAL_COMMUNITY)
Admission: EM | Admit: 2017-06-30 | Discharge: 2017-07-01 | Disposition: A | Discharge status: Home / Self Care | Payer: Medicaid Other | Attending: Emergency Medicine | Admitting: Emergency Medicine

## 2017-06-30 ENCOUNTER — Encounter (HOSPITAL_COMMUNITY): Payer: Self-pay | Admitting: Emergency Medicine

## 2017-06-30 DIAGNOSIS — F419 Anxiety disorder, unspecified: Secondary | ICD-10-CM | POA: Diagnosis not present

## 2017-06-30 DIAGNOSIS — F1994 Other psychoactive substance use, unspecified with psychoactive substance-induced mood disorder: Secondary | ICD-10-CM | POA: Diagnosis not present

## 2017-06-30 DIAGNOSIS — F329 Major depressive disorder, single episode, unspecified: Secondary | ICD-10-CM | POA: Insufficient documentation

## 2017-06-30 DIAGNOSIS — F1920 Other psychoactive substance dependence, uncomplicated: Secondary | ICD-10-CM | POA: Insufficient documentation

## 2017-06-30 DIAGNOSIS — F1910 Other psychoactive substance abuse, uncomplicated: Secondary | ICD-10-CM | POA: Diagnosis present

## 2017-06-30 DIAGNOSIS — F1290 Cannabis use, unspecified, uncomplicated: Secondary | ICD-10-CM | POA: Diagnosis not present

## 2017-06-30 DIAGNOSIS — F17210 Nicotine dependence, cigarettes, uncomplicated: Secondary | ICD-10-CM | POA: Diagnosis not present

## 2017-06-30 DIAGNOSIS — F32A Depression, unspecified: Secondary | ICD-10-CM

## 2017-06-30 LAB — BASIC METABOLIC PANEL
ANION GAP: 10 (ref 5–15)
BUN: 20 mg/dL (ref 6–20)
CO2: 27 mmol/L (ref 22–32)
Calcium: 9.1 mg/dL (ref 8.9–10.3)
Chloride: 98 mmol/L — ABNORMAL LOW (ref 101–111)
Creatinine, Ser: 0.87 mg/dL (ref 0.44–1.00)
GFR calc Af Amer: >60 mL/min (ref >60)
GFR calc non Af Amer: >60 mL/min (ref >60)
GLUCOSE: 65 mg/dL (ref 65–99)
Potassium: 3.2 mmol/L — ABNORMAL LOW (ref 3.5–5.1)
Sodium: 135 mmol/L (ref 135–145)

## 2017-06-30 LAB — CBC WITH DIFFERENTIAL/PLATELET
Basophils Absolute: 0.1 10*3/uL (ref 0.0–0.1)
Basophils Relative: 1 %
Eosinophils Absolute: 0.2 10*3/uL (ref 0.0–0.7)
Eosinophils Relative: 3 %
HEMATOCRIT: 42.7 % (ref 36.0–46.0)
Hemoglobin: 14.3 g/dL (ref 12.0–15.0)
LYMPHS ABS: 3.6 10*3/uL (ref 0.7–4.0)
Lymphocytes Relative: 42 %
MCH: 29.2 pg (ref 26.0–34.0)
MCHC: 33.5 g/dL (ref 30.0–36.0)
MCV: 87.3 fL (ref 78.0–100.0)
MONOS PCT: 6 %
Monocytes Absolute: 0.5 10*3/uL (ref 0.1–1.0)
NEUTROS ABS: 4.2 10*3/uL (ref 1.7–7.7)
Neutrophils Relative %: 48 %
Platelets: 342 10*3/uL (ref 150–400)
RBC: 4.89 MIL/uL (ref 3.87–5.11)
RDW: 13.5 % (ref 11.5–15.5)
WBC: 8.6 10*3/uL (ref 4.0–10.5)

## 2017-06-30 LAB — ETHANOL: Alcohol, Ethyl (B): <5 mg/dL (ref <5)

## 2017-06-30 MED ORDER — NICOTINE 21 MG/24HR TD PT24
21.0000 mg | MEDICATED_PATCH | Freq: Every day | TRANSDERMAL | Status: DC
  Administered 2017-06-30: 21 mg via TRANSDERMAL
  Filled 2017-06-30: qty 1

## 2017-06-30 MED ORDER — IBUPROFEN 400 MG PO TABS: 600.0000 mg | ORAL_TABLET | Freq: Three times a day (TID) | ORAL | Status: DC | PRN

## 2017-06-30 MED ORDER — ONDANSETRON HCL 4 MG PO TABS: 4.0000 mg | ORAL_TABLET | Freq: Three times a day (TID) | ORAL | Status: DC | PRN

## 2017-06-30 MED ORDER — ALUM & MAG HYDROXIDE-SIMETH 200-200-20 MG/5ML PO SUSP: 30.0000 mL | Freq: Four times a day (QID) | ORAL | Status: DC | PRN

## 2017-06-30 NOTE — ED Triage Notes (Signed)
Pt's mom took ivc paperwork out on pt for snorting pills and cutting herself. Pt states she does not want to hurt herself, but states if she could find a hole to crawl into to die.

## 2017-06-30 NOTE — BH Assessment (Addendum)
Tele Assessment Note   Brandi Hayes is an 31 y.o. female.  -Pt reviewed note by Dr. Alona Bene.  Brandi Hayes is a 31 y.o. female with PMH of polysubstance abuse and depression, not currently on medication, presents to the emergency department for evaluation after IVC paperwork taken out by mother. According to the paperwork the patient has been snorting Xanex and cutting herself. The patient endorses occasionally cutting but states it's for dealing with her depression and denies any suicidal intent. She mainly cuts at her right thigh. No cutting recently. She also endorses abusing Xanex but states she only uses with worsening depression. She does note some worsening depression but denies active suicidal or homicidal ideation. She does express some passive death wishes and being okay with "crawling in a hole to die." No plan to harm self currently. Denies using other drugs. No medical complaints at this time. She has not been taking her depression medication and states she needs to get up with her doctor to start taking this medicine. Denies any injection drug use.   Patient is upset that she is over at [facility] on IVC.  She does give permission to contact her mother.    Patient says that she does not want to be at the hospital.  She says that she had dropped out of communication for two days and her mother got worried.  Patient admits to using xanax (either orally or by snorting) that she is getting off the street.  She says her last use was today.  Use can vary.  Patient reports that she may use more than once per week but usually will use around once per week if she is feeling particularly depressed and anxious.  She said she may use half a blue pill all the way to 1.5 of a blue pill.  Patient says she does not want to kill herself.  She denies saying she wanted to crawl into a hole at first.  She later clarifies that she did not mean that she wanted to kill herself.  Patient denies any current or  recurrent SI, no plan or intention to kill herself.  Patient admits to making "sratches" to her legs and doing this only rarely.  She says that the last time she did this was a month ago.  She says that this is never with the intention to kill herself.  Patient denies any HI or A/V hallucinations.  Patient admits to some depression and anxiety.  She says she and boyfriend have financial problems.  He lost his job and they are staying in a motel.  She does not always have access to food.  She says her appetite is normal but she does not always have food available.  She says she is upset that she cannot just leave with boyfriend and stay back at the motel with him.  Patient has two children who she says stay with her sister.  Patient says she would not harm herself because it would harm them.  Patient says she saw a therapist one time when she was 19 after she had punched a window.  She denies any inpatient psychiatric or SA tx.  Patient says she wants to be back with her doctor and be on prescribed medications.  -Clinician discussed patient care with Donell Sievert, PA.  He recommended AM psych eval to uphold or rescind the IVC.  Diagnosis: G.A.D.; MDD recurrent moderate  Past Medical History:  Past Medical History:  Diagnosis Date  .  Medical history non-contributory     Past Surgical History:  Procedure Laterality Date  . NO PAST SURGERIES      Family History: History reviewed. No pertinent family history.  Social History:  reports that she has been smoking.  She has a 2.50 pack-year smoking history. She has never used smokeless tobacco. She reports that she drinks alcohol. She reports that she uses drugs, including Marijuana.  Additional Social History:  Alcohol / Drug Use Prescriptions: Xanax (off the street) Over the Counter: None History of alcohol / drug use?: Yes Substance #1 Name of Substance 1: Xanax (snorting or oral) 1 - Age of First Use: 31 years of age 45 - Amount  (size/oz): Will take a blue one (unknown dosage).  Usually half. 1 - Frequency: Around once per week. 1 - Duration: off and on depending on level of depression. 1 - Last Use / Amount: 08/14 took one blue pill  CIWA: CIWA-Ar BP: 105/75 Pulse Rate: 90 COWS:    PATIENT STRENGTHS: (choose at least two) Ability for insight Average or above average intelligence Capable of independent living Communication skills Motivation for treatment/growth Supportive family/friends  Allergies:  Allergies  Allergen Reactions  . Amoxicillin Hives  . Penicillins Hives  . Sulfa Antibiotics Hives    Home Medications:  (Not in a hospital admission)  OB/GYN Status:  Patient's last menstrual period was 06/30/2017.  General Assessment Data Location of Assessment: [facility] TTS Assessment: In system Is this a Tele or Face-to-Face Assessment?: Tele Assessment Is this an Initial Assessment or a Re-assessment for this encounter?: Initial Assessment Marital status: Single Is patient pregnant?: No Pregnancy Status: No Living Arrangements: Spouse/significant other (Staying w/ bf in hotel room. Pt is homeless) Can pt return to current living arrangement?: Yes Admission Status: Involuntary Is patient capable of signing voluntary admission?: No Referral Source: Self/Family/Friend (Pt's mother petitioned her.) Insurance type: MCD     Crisis Care Plan Living Arrangements: Spouse/significant other (Staying w/ bf in hotel room. Pt is homeless) Name of Psychiatrist: None Name of Therapist: None  Education Status Is patient currently in school?: No Highest grade of school patient has completed: 9th grade  Risk to self with the past 6 months Suicidal Ideation: No Has patient been a risk to self within the past 6 months prior to admission? : No Suicidal Intent: No Has patient had any suicidal intent within the past 6 months prior to admission? : No Is patient at risk for suicide?: Yes (Pt denies intention  but had made a statement about crawling ) Suicidal Plan?: No Has patient had any suicidal plan within the past 6 months prior to admission? : No Access to Means: No What has been your use of drugs/alcohol within the last 12 months?: Benzos Previous Attempts/Gestures: No How many times?:  (Pt denies) Other Self Harm Risks: None Triggers for Past Attempts: None known Intentional Self Injurious Behavior: Cutting Comment - Self Injurious Behavior: Will scratch herself rarely.  Month ago. Family Suicide History: No Recent stressful life event(s): Financial Problems, Turmoil (Comment) Persecutory voices/beliefs?: No Depression: Yes Depression Symptoms: Despondent, Isolating, Guilt, Loss of interest in usual pleasures, Feeling worthless/self pity Substance abuse history and/or treatment for substance abuse?: Yes Suicide prevention information given to non-admitted patients: Not applicable  Risk to Others within the past 6 months Homicidal Ideation: No Does patient have any lifetime risk of violence toward others beyond the six months prior to admission? : No Thoughts of Harm to Others: No Current Homicidal Intent: No  Current Homicidal Plan: No Access to Homicidal Means: No Identified Victim: No one History of harm to others?: Yes Assessment of Violence: In distant past Violent Behavior Description: Pt says years ago Does patient have access to weapons?: No Criminal Charges Pending?: No Does patient have a court date: No Is patient on probation?: No  Psychosis Hallucinations: None noted Delusions: None noted  Mental Status Report Appearance/Hygiene: Disheveled, In scrubs Eye Contact: Fair Motor Activity: Freedom of movement, Unremarkable Speech: Logical/coherent Level of Consciousness: Alert, Irritable Mood: Depressed, Anxious, Despair, Helpless, Sad Affect: Anxious, Depressed Anxiety Level: Moderate Thought Processes: Coherent, Relevant Judgement: Unable to  Assess Orientation: Person, Place, Time, Situation Obsessive Compulsive Thoughts/Behaviors: None  Cognitive Functioning Concentration: Normal Memory: Recent Impaired, Remote Intact IQ: Average Insight: Fair Impulse Control: Fair Appetite: Good Weight Loss:  (Pt has no access to food regularly.) Weight Gain: 0 Sleep: No Change Total Hours of Sleep: 10 Vegetative Symptoms: Staying in bed  ADLScreening [facility] Assessment Services) Patient's cognitive ability adequate to safely complete daily activities?: Yes Patient able to express need for assistance with ADLs?: Yes Independently performs ADLs?: Yes (appropriate for developmental age)  Prior Inpatient Therapy Prior Inpatient Therapy: No Prior Therapy Dates: None Prior Therapy Facilty/Provider(s): None Reason for Treatment: None  Prior Outpatient Therapy Prior Outpatient Therapy: No Prior Therapy Dates: N/A Prior Therapy Facilty/Provider(s): N/A Reason for Treatment: N/A Does patient have an ACCT team?: No Does patient have Intensive In-House Services?  : No Does patient have [facility] services? : No Does patient have P4CC services?: No  ADL Screening (condition at time of admission) Patient's cognitive ability adequate to safely complete daily activities?: Yes Is the patient deaf or have difficulty hearing?: No Does the patient have difficulty seeing, even when wearing glasses/contacts?: No Does the patient have difficulty concentrating, remembering, or making decisions?: No Patient able to express need for assistance with ADLs?: Yes Does the patient have difficulty dressing or bathing?: No Independently performs ADLs?: Yes (appropriate for developmental age) Does the patient have difficulty walking or climbing stairs?: No Weakness of Legs: None Weakness of Arms/Hands: None       Abuse/Neglect Assessment (Assessment to be complete while patient is alone) Physical Abuse: Denies Verbal Abuse: Denies Sexual Abuse:  Denies Exploitation of patient/patient's resources: Denies Self-Neglect: Denies     Merchant navy officer (For Healthcare) Does Patient Have a Medical Advance Directive?: No Would patient like information on creating a medical advance directive?: No - Patient declined    Additional Information 1:1 In Past 12 Months?: No CIRT Risk: No Elopement Risk: No Does patient have medical clearance?: Yes     Disposition:  Disposition Initial Assessment Completed for this Encounter: Yes Disposition of Patient: Other dispositions Other disposition(s): Other (Comment) (To be reviewed with PA)  Beatriz Stallion Ray 06/30/2017 9:52 PM

## 2017-06-30 NOTE — ED Notes (Signed)
Patients belongings placed in EMS locker room. 

## 2017-06-30 NOTE — ED Provider Notes (Signed)
Emergency Department Provider Note   I have reviewed the triage vital signs and the nursing notes.   HISTORY  Chief Complaint V70.1   HPI Brandi Hayes is a 31 y.o. female with PMH of polysubstance abuse and depression, not currently on medication, presents to the emergency department for evaluation after IVC paperwork taken out by mother. According to the paperwork the patient has been snorting Xanex and cutting herself. The patient endorses occasionally cutting but states it's for dealing with her depression and denies any suicidal intent. She mainly cuts at her right thigh. No cutting recently. She also endorses abusing Xanex but states she only uses with worsening depression. She does note some worsening depression but denies active suicidal or homicidal ideation. She does express some passive death wishes and being okay with "crawling in a hole to die." No plan to harm self currently. Denies using other drugs. No medical complaints at this time. She has not been taking her depression medication and states she needs to get up with her doctor to start taking this medicine. Denies any injection drug use.    Past Medical History:  Diagnosis Date  . Medical history non-contributory     Patient Active Problem List   Diagnosis Date Noted  . Facial cellulitis 08/20/2014  . Dental infection 08/20/2014    Past Surgical History:  Procedure Laterality Date  . NO PAST SURGERIES      Current Outpatient Rx  . Order #: [id number] Class: Historical Med  . Order #: [id number] Class: Historical Med  . Order #: [id number] Class: Historical Med  . Order #: [id number] Class: Historical Med  . Order #: [id number] Class: Historical Med    Allergies Amoxicillin; Penicillins; and Sulfa antibiotics  History reviewed. No pertinent family history.  Social History Social History  Substance Use Topics  . Smoking status: Current Every Day Smoker    Packs/day: 0.25    Years: 10.00  . Smokeless  tobacco: Never Used     Comment: Working on quitting  . Alcohol use Yes    Review of Systems  Constitutional: No fever/chills Eyes: No visual changes. ENT: No sore throat. Cardiovascular: Denies chest pain. Respiratory: Denies shortness of breath. Gastrointestinal: No abdominal pain.  No nausea, no vomiting.  No diarrhea.  No constipation. Genitourinary: Negative for dysuria. Musculoskeletal: Negative for back pain. Skin: Negative for rash. Neurological: Negative for headaches, focal weakness or numbness. Psychiatric:"severe" depression.   10-point ROS otherwise negative.  ____________________________________________   PHYSICAL EXAM:  VITAL SIGNS: ED Triage Vitals  Enc Vitals Group     BP 06/30/17 2003 105/75     Pulse Rate 06/30/17 2003 90     Resp 06/30/17 2003 15     Temp 06/30/17 2003 98.4 F (36.9 C)     Temp Source 06/30/17 2003 Oral     SpO2 06/30/17 2003 97 %     Weight 06/30/17 2004 100 lb (45.4 kg)     Height 06/30/17 2004 5\' 5"  (1.651 m)   Constitutional: Alert and oriented. Well appearing and in no acute distress. Eyes: Conjunctivae are normal.  Head: Atraumatic. Nose: No congestion/rhinnorhea. Mouth/Throat: Mucous membranes are moist.  Oropharynx non-erythematous. Neck: No stridor.   Cardiovascular: Normal rate, regular rhythm. Good peripheral circulation. Grossly normal heart sounds.   Respiratory: Normal respiratory effort.  No retractions. Lungs CTAB. Gastrointestinal: Soft and nontender. No distention.  Musculoskeletal: No lower extremity tenderness nor edema. No gross deformities of extremities. Neurologic:  Normal speech and language. No gross focal neurologic deficits are  appreciated.  Skin:  Skin is warm, dry and intact. Several healing linear abrasions to the right thigh.   ____________________________________________   LABS (all labs ordered are listed, but only abnormal results are displayed)  Labs Reviewed  BASIC METABOLIC PANEL -  Abnormal; Notable for the following:       Result Value   Potassium 3.2 (*)    Chloride 98 (*)    All other components within normal limits  CBC WITH DIFFERENTIAL/PLATELET  ETHANOL  URINALYSIS, ROUTINE W REFLEX MICROSCOPIC  PREGNANCY, URINE  RAPID URINE DRUG SCREEN, HOSP PERFORMED    ____________________________________________  RADIOLOGY  None ____________________________________________   PROCEDURES  Procedure(s) performed:   Procedures  None ____________________________________________   INITIAL IMPRESSION / ASSESSMENT AND PLAN / ED COURSE  Pertinent labs & imaging results that were available during my care of the patient were reviewed by me and considered in my medical decision making (see chart for details).  Patient presents to the emergency department under IVC filled out by parents for concern for self-harm and drug abuse. Patient denies active suicidal ideation but does endorse some passive death wishes. No medical complaints. She has several linear abrasions to the right thigh but nothing that appears fresh or requiring suture. Plan for labs and medical clearance. Plan to ask TTS for evaluation. I have completed and filed 1st exam IVC paperwork to uphold IVC at this time. Patient is medically clear for evaluation.    ____________________________________________  FINAL CLINICAL IMPRESSION(S) / ED DIAGNOSES  Final diagnoses:  Polysubstance abuse  Depression, unspecified depression type     MEDICATIONS GIVEN DURING THIS VISIT:  Medications  ibuprofen (ADVIL,MOTRIN) tablet 600 mg (not administered)  ondansetron (ZOFRAN) tablet 4 mg (not administered)  alum & mag hydroxide-simeth (MAALOX/MYLANTA) 200-200-20 MG/5ML suspension 30 mL (not administered)  nicotine (NICODERM CQ - dosed in mg/24 hours) patch 21 mg (not administered)     NEW OUTPATIENT MEDICATIONS STARTED DURING THIS VISIT:  None   Note:  This document was prepared using Dragon voice  recognition software and may include unintentional dictation errors.  Alona Bene, MD Emergency Medicine    Long, Arlyss Repress, MD 06/30/17 2202

## 2017-07-01 DIAGNOSIS — F1910 Other psychoactive substance abuse, uncomplicated: Secondary | ICD-10-CM | POA: Diagnosis present

## 2017-07-01 DIAGNOSIS — G4700 Insomnia, unspecified: Secondary | ICD-10-CM

## 2017-07-01 DIAGNOSIS — F1994 Other psychoactive substance use, unspecified with psychoactive substance-induced mood disorder: Secondary | ICD-10-CM | POA: Diagnosis not present

## 2017-07-01 DIAGNOSIS — F419 Anxiety disorder, unspecified: Secondary | ICD-10-CM

## 2017-07-01 DIAGNOSIS — F1290 Cannabis use, unspecified, uncomplicated: Secondary | ICD-10-CM | POA: Diagnosis not present

## 2017-07-01 DIAGNOSIS — F17210 Nicotine dependence, cigarettes, uncomplicated: Secondary | ICD-10-CM

## 2017-07-01 LAB — RAPID URINE DRUG SCREEN, HOSP PERFORMED
AMPHETAMINES: POSITIVE — AB
BARBITURATES: NOT DETECTED
Benzodiazepines: POSITIVE — AB
COCAINE: POSITIVE — AB
OPIATES: NOT DETECTED
TETRAHYDROCANNABINOL: POSITIVE — AB

## 2017-07-01 LAB — URINALYSIS, ROUTINE W REFLEX MICROSCOPIC
BILIRUBIN URINE: NEGATIVE
Glucose, UA: NEGATIVE mg/dL
Hgb urine dipstick: NEGATIVE
KETONES UR: NEGATIVE mg/dL
LEUKOCYTES UA: NEGATIVE
NITRITE: NEGATIVE
PH: 6 (ref 5.0–8.0)
Protein, ur: NEGATIVE mg/dL
Specific Gravity, Urine: 1.017 (ref 1.005–1.030)

## 2017-07-01 LAB — PREGNANCY, URINE: Preg Test, Ur: NEGATIVE

## 2017-07-01 NOTE — ED Provider Notes (Signed)
  Physical Exam  BP (!) 95/52   Pulse 72   Temp 98.2 F (36.8 C) (Tympanic)   Resp 16   Ht 5\' 5"  (1.651 m)   Wt 45.4 kg (100 lb)   LMP 06/30/2017   SpO2 98%   BMI 16.64 kg/m   Physical Exam  ED Course  Procedures  MDM [facility] Cleared for discharge. Reportedly suicidal thoughts or worse substance abuse. Will give resources and discharge.       Benjiman CorePickering, Kamerin Grumbine, MD 07/01/17 [phone]

## 2017-07-01 NOTE — ED Notes (Signed)
[phone] Zadie CleverlyKaren Jalomo- Patients Mother

## 2017-07-01 NOTE — ED Notes (Signed)
Pt has been given a breakfast tray and has eaten it. Pt is now sleeping soundly.

## 2017-07-01 NOTE — ED Notes (Signed)
Pt soundly sleeping. Will collect urine sample upon waking up.

## 2017-07-01 NOTE — Progress Notes (Signed)
Per Claudette Headonrad Withrow, DNP, patient does not meet criteria for inpatient treatment. Patient is recommended for discharge and to follow up with outpatient provider.    CSW faxed SA resources [facility] at [phone].      Jerald KiefEboni Wiley, RN notified.    Baldo DaubJolan Antonyo Hinderer MSW, LCSWA CSW Disposition [phone]

## 2017-07-01 NOTE — ED Notes (Signed)
[facility]

## 2017-07-01 NOTE — ED Notes (Signed)
Will obtain urine specimen from pt before breakfast.

## 2017-07-01 NOTE — Consult Note (Signed)
Telepsych Consultation   Reason for Consult:  Suicidal statements while intoxicated Referring Physician:  EDP Patient Identification: Brandi Hayes MRN:  [id number] Principal Diagnosis: Substance induced mood disorder [facility]) Diagnosis:   Patient Active Problem List   Diagnosis Date Noted  . Polysubstance abuse [F19.10] 07/01/2017    Priority: High  . Substance induced mood disorder ([place]) [F19.94] 07/01/2017    Priority: High  . Facial cellulitis [K56.256] 08/20/2014  . Dental infection [K04.7] 08/20/2014    Total Time spent with patient: 30 minutes  Subjective:   Brandi Hayes is a 31 y.o. female patient admitted with reports of transient suicidal statements later denied. Pt seen and chart reviewed. Pt is alert/oriented x4, calm, cooperative, and appropriate to situation. Pt denies suicidal/homicidal ideation and psychosis and does not appear to be responding to internal stimuli. Pt reports that she was high at the time (4 + substances in UDS) and that she would like to discharge to outpatient with her boyfriend and wants resources.  HPI:  I have reviewed and concur with HPI elements below, modified as follows:  "Brandi Hayes is an 31 y.o. female. -Pt reviewed note by Dr. Nanda Quinton.  Brandi Whitworth Edwardsis a 31 y.o.femalewith PMH of polysubstance abuse and depression, not currently on medication, presents to the emergency department for evaluation after IVC paperwork taken out by mother. According to the paperwork the patient has been snorting Xanex and cutting herself. The patient endorses occasionally cutting but states it's for dealing with her depression and denies any suicidal intent. She mainly cuts at her right thigh. No cutting recently. She also endorses abusing Xanex but states she only uses with worsening depression. She does note some worsening depression but denies active suicidal or homicidal ideation. She does express some passive death wishes and being okay with  "crawling in a hole to die." No plan to harm self currently. Denies using other drugs. No medical complaints at this time. She has not been taking her depression medication and states she needs to get up with her doctor to start taking this medicine. Denies any injection drug use.   Patient says that she does not want to be at the hospital.  She says that she had dropped out of communication for two days and her mother got worried.  Patient admits to using xanax (either orally or by snorting) that she is getting off the street.  She says her last use was today.  Use can vary.  Patient reports that she may use more than once per week but usually will use around once per week if she is feeling particularly depressed and anxious.  She said she may use half a blue pill all the way to 1.5 of a blue pill.  Patient says she does not want to kill herself.  She denies saying she wanted to crawl into a hole at first.  She later clarifies that she did not mean that she wanted to kill herself.  Patient denies any current or recurrent SI, no plan or intention to kill herself.  Patient admits to making "sratches" to her legs and doing this only rarely.  She says that the last time she did this was a month ago.  She says that this is never with the intention to kill herself."  Pt has been calm and cooperative in ED, denying any intent to harm herself. Seen today on 07/01/17 as above for evaluation.   Past Psychiatric History: substance abuse, depression  Risk to  Self: Suicidal Ideation: No Suicidal Intent: No Is patient at risk for suicide?: Yes (Pt denies intention but had made a statement about crawling ) Suicidal Plan?: No Access to Means: No What has been your use of drugs/alcohol within the last 12 months?: Benzos How many times?:  (Pt denies) Other Self Harm Risks: None Triggers for Past Attempts: None known Intentional Self Injurious Behavior: Cutting Comment - Self Injurious Behavior: Will scratch  herself rarely.  Month ago. Risk to Others: Homicidal Ideation: No Thoughts of Harm to Others: No Current Homicidal Intent: No Current Homicidal Plan: No Access to Homicidal Means: No Identified Victim: No one History of harm to others?: Yes Assessment of Violence: In distant past Violent Behavior Description: Pt says years ago Does patient have access to weapons?: No Criminal Charges Pending?: No Does patient have a court date: No Prior Inpatient Therapy: Prior Inpatient Therapy: No Prior Therapy Dates: None Prior Therapy Facilty/Provider(s): None Reason for Treatment: None Prior Outpatient Therapy: Prior Outpatient Therapy: No Prior Therapy Dates: N/A Prior Therapy Facilty/Provider(s): N/A Reason for Treatment: N/A Does patient have an ACCT team?: No Does patient have Intensive In-House Services?  : No Does patient have [facility] services? : No Does patient have P4CC services?: No  Past Medical History:  Past Medical History:  Diagnosis Date  . Medical history non-contributory     Past Surgical History:  Procedure Laterality Date  . NO PAST SURGERIES     Family History: History reviewed. No pertinent family history. Family Psychiatric  History: depression Social History:  History  Alcohol Use  . Yes     History  Drug Use  . Types: Marijuana    Social History   Social History  . Marital status: Single    Spouse name: N/A  . Number of children: N/A  . Years of education: N/A   Social History Main Topics  . Smoking status: Current Every Day Smoker    Packs/day: 0.25    Years: 10.00  . Smokeless tobacco: Never Used     Comment: Working on quitting  . Alcohol use Yes  . Drug use: Yes    Types: Marijuana  . Sexual activity: Not Currently    Birth control/ protection: None, Abstinence   Other Topics Concern  . None   Social History Narrative  . None   Additional Social History:    Allergies:   Allergies  Allergen Reactions  . Amoxicillin Hives   . Penicillins Hives  . Sulfa Antibiotics Hives    Labs:  Results for orders placed or performed during the hospital encounter of 06/30/17 (from the past 48 hour(s))  CBC with Differential     Status: None   Collection Time: 06/30/17  8:59 PM  Result Value Ref Range   WBC 8.6 4.0 - 10.5 K/uL   RBC 4.89 3.87 - 5.11 MIL/uL   Hemoglobin 14.3 12.0 - 15.0 g/dL   HCT 42.7 36.0 - 46.0 %   MCV 87.3 78.0 - 100.0 fL   MCH 29.2 26.0 - 34.0 pg   MCHC 33.5 30.0 - 36.0 g/dL   RDW 13.5 11.5 - 15.5 %   Platelets 342 150 - 400 K/uL   Neutrophils Relative % 48 %   Neutro Abs 4.2 1.7 - 7.7 K/uL   Lymphocytes Relative 42 %   Lymphs Abs 3.6 0.7 - 4.0 K/uL   Monocytes Relative 6 %   Monocytes Absolute 0.5 0.1 - 1.0 K/uL   Eosinophils Relative 3 %   Eosinophils  Absolute 0.2 0.0 - 0.7 K/uL   Basophils Relative 1 %   Basophils Absolute 0.1 0.0 - 0.1 K/uL  Basic metabolic panel     Status: Abnormal   Collection Time: 06/30/17  8:59 PM  Result Value Ref Range   Sodium 135 135 - 145 mmol/L   Potassium 3.2 (L) 3.5 - 5.1 mmol/L   Chloride 98 (L) 101 - 111 mmol/L   CO2 27 22 - 32 mmol/L   Glucose, Bld 65 65 - 99 mg/dL   BUN 20 6 - 20 mg/dL   Creatinine, Ser 0.87 0.44 - 1.00 mg/dL   Calcium 9.1 8.9 - 10.3 mg/dL   GFR calc non Af Amer >60 >60 mL/min   GFR calc Af Amer >60 >60 mL/min    Comment: (NOTE) The eGFR has been calculated using the CKD EPI equation. This calculation has not been validated in all clinical situations. eGFR's persistently <60 mL/min signify possible Chronic Kidney Disease.    Anion gap 10 5 - 15  Ethanol     Status: None   Collection Time: 06/30/17  8:59 PM  Result Value Ref Range   Alcohol, Ethyl (B) <5 <5 mg/dL    Comment:        LOWEST DETECTABLE LIMIT FOR SERUM ALCOHOL IS 5 mg/dL FOR MEDICAL PURPOSES ONLY   Urinalysis, Routine w reflex microscopic     Status: None   Collection Time: 07/01/17  8:15 AM  Result Value Ref Range   Color, Urine YELLOW YELLOW    APPearance CLEAR CLEAR   Specific Gravity, Urine 1.017 1.005 - 1.030   pH 6.0 5.0 - 8.0   Glucose, UA NEGATIVE NEGATIVE mg/dL   Hgb urine dipstick NEGATIVE NEGATIVE   Bilirubin Urine NEGATIVE NEGATIVE   Ketones, ur NEGATIVE NEGATIVE mg/dL   Protein, ur NEGATIVE NEGATIVE mg/dL   Nitrite NEGATIVE NEGATIVE   Leukocytes, UA NEGATIVE NEGATIVE  Pregnancy, urine     Status: None   Collection Time: 07/01/17  8:15 AM  Result Value Ref Range   Preg Test, Ur NEGATIVE NEGATIVE  Rapid urine drug screen (hospital performed)     Status: Abnormal   Collection Time: 07/01/17  8:15 AM  Result Value Ref Range   Opiates NONE DETECTED NONE DETECTED   Cocaine POSITIVE (A) NONE DETECTED   Benzodiazepines POSITIVE (A) NONE DETECTED   Amphetamines POSITIVE (A) NONE DETECTED   Tetrahydrocannabinol POSITIVE (A) NONE DETECTED   Barbiturates NONE DETECTED NONE DETECTED    Comment:        DRUG SCREEN FOR MEDICAL PURPOSES ONLY.  IF CONFIRMATION IS NEEDED FOR ANY PURPOSE, NOTIFY LAB WITHIN 5 DAYS.        LOWEST DETECTABLE LIMITS FOR URINE DRUG SCREEN Drug Class       Cutoff (ng/mL) Amphetamine      1000 Barbiturate      200 Benzodiazepine   270 Tricyclics       623 Opiates          300 Cocaine          300 THC              50     Current Facility-Administered Medications  Medication Dose Route Frequency Provider Last Rate Last Dose  . alum & mag hydroxide-simeth (MAALOX/MYLANTA) 200-200-20 MG/5ML suspension 30 mL  30 mL Oral Q6H PRN Long, Wonda Olds, MD      . ibuprofen (ADVIL,MOTRIN) tablet 600 mg  600 mg Oral Q8H PRN Long, Wonda Olds, MD      .  nicotine (NICODERM CQ - dosed in mg/24 hours) patch 21 mg  21 mg Transdermal Daily Long, Wonda Olds, MD   21 mg at 06/30/17 2251  . ondansetron (ZOFRAN) tablet 4 mg  4 mg Oral Q8H PRN Long, Wonda Olds, MD       Current Outpatient Prescriptions  Medication Sig Dispense Refill  . acetaminophen (TYLENOL) 500 MG tablet Take 1,000 mg by mouth every 6 (six) hours as  needed (dental pain).    Marland Kitchen ALPRAZolam (XANAX) 0.25 MG tablet Take 0.25 mg by mouth 2 (two) times daily as needed for anxiety.    Marland Kitchen aspirin 81 MG chewable tablet Chew 162 mg by mouth daily as needed (dental pain).    Marland Kitchen ibuprofen (ADVIL,MOTRIN) 200 MG tablet Take 800 mg by mouth every 6 (six) hours as needed (dental pain).    Marland Kitchen oxyCODONE-acetaminophen (PERCOCET/ROXICET) 5-325 MG per tablet Take 1 tablet by mouth every 4 (four) hours as needed for severe pain.      Musculoskeletal: UTO, camera  Psychiatric Specialty Exam: Physical Exam  Review of Systems  Psychiatric/Behavioral: Positive for depression and substance abuse. Negative for hallucinations and suicidal ideas. The patient is nervous/anxious and has insomnia.   All other systems reviewed and are negative.   Blood pressure 104/60, pulse 83, temperature 98.2 F (36.8 C), temperature source Tympanic, resp. rate 15, height 5' 5"  (1.651 m), weight 45.4 kg (100 lb), last menstrual period 06/30/2017, SpO2 97 %.Body mass index is 16.64 kg/m.  General Appearance: Disheveled  Eye Contact:  Fair  Speech:  Clear and Coherent and Normal Rate  Volume:  Normal  Mood:  Anxious and Depressed  Affect:  Appropriate, Congruent and Depressed  Thought Process:  Coherent, Goal Directed, Linear and Descriptions of Associations: Intact  Orientation:  Full (Time, Place, and Person)  Thought Content:  Focused on discharge planning  Suicidal Thoughts:  No  Homicidal Thoughts:  No  Memory:  Immediate;   Fair Recent;   Fair Remote;   Fair  Judgement:  Fair  Insight:  Fair  Psychomotor Activity:  Normal  Concentration:  Concentration: Fair and Attention Span: Fair  Recall:  [employer] of Knowledge:  Fair  Language:  Fair  Akathisia:  No  Handed:    AIMS (if indicated):     Assets:  Communication Skills Desire for Improvement Resilience Social Support  ADL's:  Intact  Cognition:  WNL  Sleep:      Treatment Plan Summary: Substance induced  mood disorder ([place]) stable for outpatient management   Disposition: No evidence of imminent risk to self or others at present.   Patient does not meet criteria for psychiatric inpatient admission. Supportive therapy provided about ongoing stressors. Refer to IOP. Discussed crisis plan, support from social network, calling 911, coming to the Emergency Department, and calling Suicide Hotline.  Benjamine Mola, FNP 07/01/2017 9:30 AM

## 2017-11-02 ENCOUNTER — Encounter: Payer: Self-pay | Admitting: Adult Health

## 2017-11-02 ENCOUNTER — Ambulatory Visit (INDEPENDENT_AMBULATORY_CARE_PROVIDER_SITE_OTHER): Payer: Medicaid Other | Admitting: Adult Health

## 2017-11-02 VITALS — BP 100/60 | HR 69 | Ht 65.0 in | Wt 103.0 lb

## 2017-11-02 DIAGNOSIS — F17200 Nicotine dependence, unspecified, uncomplicated: Secondary | ICD-10-CM

## 2017-11-02 DIAGNOSIS — Z3201 Encounter for pregnancy test, result positive: Secondary | ICD-10-CM | POA: Diagnosis not present

## 2017-11-02 DIAGNOSIS — O3680X0 Pregnancy with inconclusive fetal viability, not applicable or unspecified: Secondary | ICD-10-CM

## 2017-11-02 DIAGNOSIS — Z3A01 Less than 8 weeks gestation of pregnancy: Secondary | ICD-10-CM

## 2017-11-02 DIAGNOSIS — O26851 Spotting complicating pregnancy, first trimester: Secondary | ICD-10-CM

## 2017-11-02 DIAGNOSIS — N926 Irregular menstruation, unspecified: Secondary | ICD-10-CM | POA: Diagnosis not present

## 2017-11-02 DIAGNOSIS — Z331 Pregnant state, incidental: Secondary | ICD-10-CM | POA: Diagnosis not present

## 2017-11-02 LAB — POCT URINE PREGNANCY: PREG TEST UR: POSITIVE — AB

## 2017-11-02 NOTE — Patient Instructions (Signed)
First Trimester of Pregnancy The first trimester of pregnancy is from week 1 until the end of week 13 (months 1 through 3). A week after a sperm fertilizes an egg, the egg will implant on the wall of the uterus. This embryo will begin to develop into a baby. Genes from you and your partner will form the baby. The female genes will determine whether the baby will be a boy or a girl. At 6-8 weeks, the eyes and face will be formed, and the heartbeat can be seen on ultrasound. At the end of 12 weeks, all the baby's organs will be formed. Now that you are pregnant, you will want to do everything you can to have a healthy baby. Two of the most important things are to get good prenatal care and to follow your health care provider's instructions. Prenatal care is all the medical care you receive before the baby's birth. This care will help prevent, find, and treat any problems during the pregnancy and childbirth. Body changes during your first trimester Your body goes through many changes during pregnancy. The changes vary from woman to woman.  You may gain or lose a couple of pounds at first.  You may feel sick to your stomach (nauseous) and you may throw up (vomit). If the vomiting is uncontrollable, call your health care provider.  You may tire easily.  You may develop headaches that can be relieved by medicines. All medicines should be approved by your health care provider.  You may urinate more often. Painful urination may mean you have a bladder infection.  You may develop heartburn as a result of your pregnancy.  You may develop constipation because certain hormones are causing the muscles that push stool through your intestines to slow down.  You may develop hemorrhoids or swollen veins (varicose veins).  Your breasts may begin to grow larger and become tender. Your nipples may stick out more, and the tissue that surrounds them (areola) may become darker.  Your gums may bleed and may be  sensitive to brushing and flossing.  Dark spots or blotches (chloasma, mask of pregnancy) may develop on your face. This will likely fade after the baby is born.  Your menstrual periods will stop.  You may have a loss of appetite.  You may develop cravings for certain kinds of food.  You may have changes in your emotions from day to day, such as being excited to be pregnant or being concerned that something may go wrong with the pregnancy and baby.  You may have more vivid and strange dreams.  You may have changes in your hair. These can include thickening of your hair, rapid growth, and changes in texture. Some women also have hair loss during or after pregnancy, or hair that feels dry or thin. Your hair will most likely return to normal after your baby is born.  What to expect at prenatal visits During a routine prenatal visit:  You will be weighed to make sure you and the baby are growing normally.  Your blood pressure will be taken.  Your abdomen will be measured to track your baby's growth.  The fetal heartbeat will be listened to between weeks 10 and 14 of your pregnancy.  Test results from any previous visits will be discussed.  Your health care provider may ask you:  How you are feeling.  If you are feeling the baby move.  If you have had any abnormal symptoms, such as leaking fluid, bleeding, severe headaches,   or abdominal cramping.  If you are using any tobacco products, including cigarettes, chewing tobacco, and electronic cigarettes.  If you have any questions.  Other tests that may be performed during your first trimester include:  Blood tests to find your blood type and to check for the presence of any previous infections. The tests will also be used to check for low iron levels (anemia) and protein on red blood cells (Rh antibodies). Depending on your risk factors, or if you previously had diabetes during pregnancy, you may have tests to check for high blood  sugar that affects pregnant women (gestational diabetes).  Urine tests to check for infections, diabetes, or protein in the urine.  An ultrasound to confirm the proper growth and development of the baby.  Fetal screens for spinal cord problems (spina bifida) and Down syndrome.  HIV (human immunodeficiency virus) testing. Routine prenatal testing includes screening for HIV, unless you choose not to have this test.  You may need other tests to make sure you and the baby are doing well.  Follow these instructions at home: Medicines  Follow your health care provider's instructions regarding medicine use. Specific medicines may be either safe or unsafe to take during pregnancy.  Take a prenatal vitamin that contains at least 600 micrograms (mcg) of folic acid.  If you develop constipation, try taking a stool softener if your health care provider approves. Eating and drinking  Eat a balanced diet that includes fresh fruits and vegetables, whole grains, good sources of protein such as meat, eggs, or tofu, and low-fat dairy. Your health care provider will help you determine the amount of weight gain that is right for you.  Avoid raw meat and uncooked cheese. These carry germs that can cause birth defects in the baby.  Eating four or five small meals rather than three large meals a day may help relieve nausea and vomiting. If you start to feel nauseous, eating a few soda crackers can be helpful. Drinking liquids between meals, instead of during meals, also seems to help ease nausea and vomiting.  Limit foods that are high in fat and processed sugars, such as fried and sweet foods.  To prevent constipation: ? Eat foods that are high in fiber, such as fresh fruits and vegetables, whole grains, and beans. ? Drink enough fluid to keep your urine clear or pale yellow. Activity  Exercise only as directed by your health care provider. Most women can continue their usual exercise routine during  pregnancy. Try to exercise for 30 minutes at least 5 days a week. Exercising will help you: ? Control your weight. ? Stay in shape. ? Be prepared for labor and delivery.  Experiencing pain or cramping in the lower abdomen or lower back is a good sign that you should stop exercising. Check with your health care provider before continuing with normal exercises.  Try to avoid standing for long periods of time. Move your legs often if you must stand in one place for a long time.  Avoid heavy lifting.  Wear low-heeled shoes and practice good posture.  You may continue to have sex unless your health care provider tells you not to. Relieving pain and discomfort  Wear a good support bra to relieve breast tenderness.  Take warm sitz baths to soothe any pain or discomfort caused by hemorrhoids. Use hemorrhoid cream if your health care provider approves.  Rest with your legs elevated if you have leg cramps or low back pain.  If you develop   varicose veins in your legs, wear support hose. Elevate your feet for 15 minutes, 3-4 times a day. Limit salt in your diet. Prenatal care  Schedule your prenatal visits by the twelfth week of pregnancy. They are usually scheduled monthly at first, then more often in the last 2 months before delivery.  Write down your questions. Take them to your prenatal visits.  Keep all your prenatal visits as told by your health care provider. This is important. Safety  Wear your seat belt at all times when driving.  Make a list of emergency phone numbers, including numbers for family, friends, the hospital, and police and fire departments. General instructions  Ask your health care provider for a referral to a local prenatal education class. Begin classes no later than the beginning of month 6 of your pregnancy.  Ask for help if you have counseling or nutritional needs during pregnancy. Your health care provider can offer advice or refer you to specialists for help  with various needs.  Do not use hot tubs, steam rooms, or saunas.  Do not douche or use tampons or scented sanitary pads.  Do not cross your legs for long periods of time.  Avoid cat litter boxes and soil used by cats. These carry germs that can cause birth defects in the baby and possibly loss of the fetus by miscarriage or stillbirth.  Avoid all smoking, herbs, alcohol, and medicines not prescribed by your health care provider. Chemicals in these products affect the formation and growth of the baby.  Do not use any products that contain nicotine or tobacco, such as cigarettes and e-cigarettes. If you need help quitting, ask your health care provider. You may receive counseling support and other resources to help you quit.  Schedule a dentist appointment. At home, brush your teeth with a soft toothbrush and be gentle when you floss. Contact a health care provider if:  You have dizziness.  You have mild pelvic cramps, pelvic pressure, or nagging pain in the abdominal area.  You have persistent nausea, vomiting, or diarrhea.  You have a bad smelling vaginal discharge.  You have pain when you urinate.  You notice increased swelling in your face, hands, legs, or ankles.  You are exposed to fifth disease or chickenpox.  You are exposed to German measles (rubella) and have never had it. Get help right away if:  You have a fever.  You are leaking fluid from your vagina.  You have spotting or bleeding from your vagina.  You have severe abdominal cramping or pain.  You have rapid weight gain or loss.  You vomit blood or material that looks like coffee grounds.  You develop a severe headache.  You have shortness of breath.  You have any kind of trauma, such as from a fall or a car accident. Summary  The first trimester of pregnancy is from week 1 until the end of week 13 (months 1 through 3).  Your body goes through many changes during pregnancy. The changes vary from  woman to woman.  You will have routine prenatal visits. During those visits, your health care provider will examine you, discuss any test results you may have, and talk with you about how you are feeling. This information is not intended to replace advice given to you by your health care provider. Make sure you discuss any questions you have with your health care provider. Document Released: 10/28/2001 Document Revised: 10/15/2016 Document Reviewed: 10/15/2016 Elsevier Interactive Patient Education  2017 Elsevier   Inc.  

## 2017-11-02 NOTE — Progress Notes (Signed)
Subjective:     Patient ID: Brandi Hayes, female   DOB: May 01, 1986, 31 y.o.   MRN: [id number]  HPI Brandi Hayes is a 31 year old white female in for UPT, has missed a period and then had brown spotting.Has had 3+HPT.   Review of Systems +missed period with 3+HPT +spotting, brown ,denies any pain or nausea/vomiting Reviewed past medical,surgical, social and family history. Reviewed medications and allergies.     Objective:   Physical Exam BP 100/60 (BP Location: Left Arm, Patient Position: Sitting, Cuff Size: Normal)   Pulse 69   Ht 5\' 5"  (1.651 m)   Wt 103 lb (46.7 kg)   LMP 10/08/2017   BMI 17.14 kg/m UPT +, about 3+4 weeks by LMP with EDD 07/15/18.Skin warm and dry. Neck: mid line trachea, normal thyroid, good ROM, no lymphadenopathy noted. Lungs: clear to ausculation bilaterally. Cardiovascular: regular rate and rhythm. Abdomen is soft and non tender.Blood type O+ in CHL  PHQ 2 score 0.     Assessment:     1. Pregnancy examination or test, positive result   2. Less than [redacted] weeks gestation of pregnancy   3. Spotting affecting pregnancy in first trimester   4. Encounter to determine fetal viability of pregnancy, single or unspecified fetus   5. Smoker       Plan:     Pelvic rest Check QHCG and progesterone level Return in 2 weeks for dating US Review handouts on first trimester and by [facility]  Decrease smoking and no THC

## 2017-11-03 ENCOUNTER — Telehealth: Payer: Self-pay | Admitting: Adult Health

## 2017-11-03 LAB — BETA HCG QUANT (REF LAB): HCG QUANT: 414 m[IU]/mL

## 2017-11-03 LAB — PROGESTERONE: PROGESTERONE: 5.6 ng/mL

## 2017-11-03 NOTE — Telephone Encounter (Signed)
Left message with joey to call me.

## 2017-11-04 ENCOUNTER — Telehealth: Payer: Self-pay | Admitting: Adult Health

## 2017-11-04 DIAGNOSIS — Z3490 Encounter for supervision of normal pregnancy, unspecified, unspecified trimester: Secondary | ICD-10-CM

## 2017-11-04 NOTE — Telephone Encounter (Signed)
Pt aware that QHCG 414 and progesterone level low at 5.6,[facility]bling can supplement with progesterone if dropping then may be miscarriage

## 2017-11-04 NOTE — Telephone Encounter (Signed)
Patient called stating that she was returning Jennifer's phone call from yesterday. Please contact pt °

## 2017-11-05 ENCOUNTER — Telehealth: Payer: Self-pay | Admitting: *Deleted

## 2017-11-05 LAB — BETA HCG QUANT (REF LAB): hCG Quant: 401 m[IU]/mL

## 2017-11-05 NOTE — Telephone Encounter (Signed)
Pt informed HCG was 401 so needed it repeated next week per Victorino DikeJennifer. Pt stated she would come next Wednesday.

## 2017-11-11 ENCOUNTER — Other Ambulatory Visit: Payer: Self-pay

## 2017-11-12 ENCOUNTER — Other Ambulatory Visit: Payer: Self-pay

## 2017-11-13 ENCOUNTER — Other Ambulatory Visit: Payer: Medicaid Other

## 2017-11-14 LAB — BETA HCG QUANT (REF LAB): hCG Quant: 80 m[IU]/mL

## 2017-11-16 ENCOUNTER — Telehealth: Payer: Self-pay | Admitting: *Deleted

## 2017-11-16 NOTE — Telephone Encounter (Signed)
Patient informed HCG down to 80 and will need repeat in 1 week. Informed she could cancel or keep u/s if desired but results did look like a miscarriage.  Patient stated she wanted to keep appointment to see if there was a heartbeat.  Informed it was very likely there would not be one but could keep appointment as scheduled.  Verbalized understanding.

## 2017-11-16 NOTE — Telephone Encounter (Signed)
LMOVM to return call.

## 2017-11-19 ENCOUNTER — Other Ambulatory Visit: Payer: Medicaid Other

## 2017-11-24 ENCOUNTER — Other Ambulatory Visit: Payer: Self-pay

## 2017-11-24 ENCOUNTER — Other Ambulatory Visit: Payer: Medicaid Other

## 2017-11-25 LAB — BETA HCG QUANT (REF LAB): hCG Quant: 16 m[IU]/mL

## 2017-11-30 ENCOUNTER — Telehealth: Payer: Self-pay | Admitting: *Deleted

## 2017-11-30 NOTE — Telephone Encounter (Signed)
Informed patient she needs repeat Hcg in 2 weeks. Will come on 1/25.

## 2017-12-11 ENCOUNTER — Other Ambulatory Visit: Payer: Medicaid Other

## 2017-12-11 DIAGNOSIS — O039 Complete or unspecified spontaneous abortion without complication: Secondary | ICD-10-CM

## 2017-12-12 LAB — BETA HCG QUANT (REF LAB)

## 2018-10-19 ENCOUNTER — Ambulatory Visit: Payer: Self-pay | Admitting: Adult Health

## 2018-10-27 ENCOUNTER — Ambulatory Visit: Payer: Self-pay | Admitting: Adult Health

## 2023-05-06 ENCOUNTER — Emergency Department (HOSPITAL_COMMUNITY): Payer: Medicaid Other

## 2023-05-06 ENCOUNTER — Other Ambulatory Visit: Payer: Self-pay

## 2023-05-06 ENCOUNTER — Emergency Department (HOSPITAL_COMMUNITY)
Admission: EM | Admit: 2023-05-06 | Discharge: 2023-05-06 | Disposition: A | Discharge status: Home / Self Care | Payer: Medicaid Other | Attending: Emergency Medicine | Admitting: Emergency Medicine

## 2023-05-06 ENCOUNTER — Encounter (HOSPITAL_COMMUNITY): Payer: Self-pay | Admitting: Emergency Medicine

## 2023-05-06 DIAGNOSIS — Z3A10 10 weeks gestation of pregnancy: Secondary | ICD-10-CM | POA: Diagnosis not present

## 2023-05-06 DIAGNOSIS — O200 Threatened abortion: Secondary | ICD-10-CM | POA: Diagnosis not present

## 2023-05-06 DIAGNOSIS — O209 Hemorrhage in early pregnancy, unspecified: Secondary | ICD-10-CM | POA: Diagnosis present

## 2023-05-06 LAB — CBC
HCT: 31 % — ABNORMAL LOW (ref 36.0–46.0)
Hemoglobin: 10.1 g/dL — ABNORMAL LOW (ref 12.0–15.0)
MCH: 28.7 pg (ref 26.0–34.0)
MCHC: 32.6 g/dL (ref 30.0–36.0)
MCV: 88.1 fL (ref 80.0–100.0)
Platelets: 272 10*3/uL (ref 150–400)
RBC: 3.52 MIL/uL — ABNORMAL LOW (ref 3.87–5.11)
RDW: 12.8 % (ref 11.5–15.5)
WBC: 6.2 10*3/uL (ref 4.0–10.5)
nRBC: 0 % (ref 0.0–0.2)

## 2023-05-06 LAB — BASIC METABOLIC PANEL
Anion gap: 6 (ref 5–15)
BUN: 9 mg/dL (ref 6–20)
CO2: 24 mmol/L (ref 22–32)
Calcium: 8.4 mg/dL — ABNORMAL LOW (ref 8.9–10.3)
Chloride: 103 mmol/L (ref 98–111)
Creatinine, Ser: 0.52 mg/dL (ref 0.44–1.00)
GFR, Estimated: >60 mL/min (ref >60)
Glucose, Bld: 102 mg/dL — ABNORMAL HIGH (ref 70–99)
Potassium: 3.7 mmol/L (ref 3.5–5.1)
Sodium: 133 mmol/L — ABNORMAL LOW (ref 135–145)

## 2023-05-06 LAB — HCG, QUANTITATIVE, PREGNANCY: hCG, Beta Chain, Quant, S: >250000 m[IU]/mL — ABNORMAL HIGH (ref <5)

## 2023-05-06 LAB — TYPE AND SCREEN
ABO/RH(D): O POS
Antibody Screen: NEGATIVE

## 2023-05-06 NOTE — Discharge Instructions (Signed)
Follow-up with the GYN either at [facility] or [facility].  If you develop heavy bleeding or heavy pain then you should get seen in [place] or [facility]

## 2023-05-06 NOTE — ED Notes (Signed)
Pelvic tray is setup at bedside

## 2023-05-06 NOTE — ED Notes (Signed)
Pt returned from US

## 2023-05-06 NOTE — ED Triage Notes (Signed)
Pt via POV c/o vaginal bleeding x 10 days with clots. She took a home pregnancy test on Friday that was positive and is reporting abdominal pain. G5P3

## 2023-05-06 NOTE — ED Provider Notes (Signed)
[place] EMERGENCY DEPARTMENT AT [facility] Provider Note   CSN: [id number] Arrival date & time: 05/06/23  1522     History  Chief Complaint  Patient presents with   suspected miscarriage    MYRANDA PAVONE is a 37 y.o. female.  Patient states she is pregnant and has been having vaginal bleeding.  No significant abdominal discomfort  The history is provided by the patient and medical records. No language interpreter was used.  Vaginal Bleeding Quality:  Dark red Severity:  Moderate Onset quality:  Sudden Timing:  Constant Progression:  Waxing and waning Chronicity:  New Possible pregnancy: yes   Relieved by:  Nothing Worsened by:  Nothing Ineffective treatments:  None tried Associated symptoms: no abdominal pain, no back pain and no fatigue        Home Medications Prior to Admission medications   Medication Sig Start Date End Date Taking? Authorizing Provider  acetaminophen (TYLENOL) 500 MG tablet Take 1,000 mg by mouth every 6 (six) hours as needed (dental pain).    [provider]  Pediatric Multiple Vit-C-FA (FLINSTONES GUMMIES OMEGA-3 DHA PO) Take by mouth. Takes 2 daily    [provider]      Allergies    Amoxicillin, Penicillins, and Sulfa antibiotics    Review of Systems   Review of Systems  Constitutional:  Negative for appetite change and fatigue.  HENT:  Negative for congestion, ear discharge and sinus pressure.   Eyes:  Negative for discharge.  Respiratory:  Negative for cough.   Cardiovascular:  Negative for chest pain.  Gastrointestinal:  Negative for abdominal pain and diarrhea.  Genitourinary:  Positive for vaginal bleeding. Negative for frequency and hematuria.  Musculoskeletal:  Negative for back pain.  Skin:  Negative for rash.  Neurological:  Negative for seizures and headaches.  Psychiatric/Behavioral:  Negative for hallucinations.     Physical Exam Updated Vital Signs BP (!) 111/59 (BP Location: Right  Arm)   Pulse 81   Temp 97.6 F (36.4 C) (Oral)   Resp 14   Ht 5\' 5"  (1.651 m)   Wt 47.6 kg   LMP  (LMP Unknown)   SpO2 98%   Breastfeeding No   BMI 17.47 kg/m  Physical Exam Vitals and nursing note reviewed.  Constitutional:      Appearance: She is well-developed.  HENT:     Head: Normocephalic.     Nose: Nose normal.  Eyes:     General: No scleral icterus.    Conjunctiva/sclera: Conjunctivae normal.  Neck:     Thyroid: No thyromegaly.  Cardiovascular:     Rate and Rhythm: Normal rate and regular rhythm.     Heart sounds: No murmur heard.    No friction rub. No gallop.  Pulmonary:     Breath sounds: No stridor. No wheezing or rales.  Chest:     Chest wall: No tenderness.  Abdominal:     General: There is no distension.     Tenderness: There is no abdominal tenderness. There is no rebound.  Musculoskeletal:        General: Normal range of motion.     Cervical back: Neck supple.  Lymphadenopathy:     Cervical: No cervical adenopathy.  Skin:    Findings: No erythema or rash.  Neurological:     Mental Status: She is alert and oriented to person, place, and time.     Motor: No abnormal muscle tone.     Coordination: Coordination normal.  Psychiatric:  Behavior: Behavior normal.     ED Results / Procedures / Treatments   Labs (all labs ordered are listed, but only abnormal results are displayed) Labs Reviewed  CBC - Abnormal; Notable for the following components:      Result Value   RBC 3.52 (*)    Hemoglobin 10.1 (*)    HCT 31.0 (*)    All other components within normal limits  BASIC METABOLIC PANEL - Abnormal; Notable for the following components:   Sodium 133 (*)    Glucose, Bld 102 (*)    Calcium 8.4 (*)    All other components within normal limits  HCG, QUANTITATIVE, PREGNANCY - Abnormal; Notable for the following components:   hCG, Beta Chain, Quant, S >250,000 (*)    All other components within normal limits  TYPE AND SCREEN     EKG None  Radiology US OB Comp < 14 Wks  Result Date: 05/06/2023 CLINICAL DATA:  Vaginal bleeding x3 days. EXAM: OBSTETRIC <14 WK ULTRASOUND TECHNIQUE: Transabdominal ultrasound was performed for evaluation of the gestation as well as the maternal uterus and adnexal regions. COMPARISON:  Apr 12, 2011 FINDINGS: Intrauterine gestational sac: Single Yolk sac:  Not Visualized. Embryo:  Visualized. Cardiac Activity: Visualized. Heart Rate: 169 bpm CRL:   3.11 mm   10 w 0 d                  [place] EDC: December 01, 2023 Subchorionic hemorrhage:  Large (7.6 cm x 6.3 cm x 6.0 cm), complex Maternal uterus/adnexae: The right and left ovaries are not visualized. No pelvic free fluid is seen. IMPRESSION: 1. Single, viable intrauterine pregnancy at approximately 10 weeks and 0 days gestation by ultrasound evaluation. 2. Large, complex subchorionic hemorrhage. Electronically Signed   By: Aram Candela M.D.   On: 05/06/2023 17:40    Procedures Procedures    Medications Ordered in ED Medications - No data to display  ED Course/ Medical Decision Making/ A&P  Patient with 10-week pregnancy that is intrauterine with significant blood seen on ultrasound.  I spoke with OB/GYN and they are helping to arrange follow-up in the next week or 2.                           Medical Decision Making Amount and/or Complexity of Data Reviewed Labs: ordered. Radiology: ordered.     This patient presents to the ED for concern of vaginal bleeding, this involves an extensive number of treatment options, and is a complaint that carries with it a high risk of complications and morbidity.  The differential diagnosis includes miscarriage, irregular menses   Co morbidities that complicate the patient evaluation  None   Additional history obtained:  Additional history obtained from patient External records from outside source obtained and reviewed including hospital records   Lab Tests:  I Ordered, and  personally interpreted labs.  The pertinent results include: Globin 10   Imaging Studies ordered:  I ordered imaging studies including ultrasound pelvis I independently visualized and interpreted imaging which showed 10-week intrauterine pregnancy with subchorionic bleeding I agree with the radiologist interpretation   Cardiac Monitoring: / EKG:  The patient was maintained on a cardiac monitor.  I personally viewed and interpreted the cardiac monitored which showed an underlying rhythm of: Normal sinus rhythm   Consultations Obtained:  I requested consultation with the GYN and discussed lab and imaging findings as well as pertinent plan - they recommend: Follow-up  as outpatient   Problem List / ED Course / Critical interventions / Medication management  Atrial bleeding No medicines given Reevaluation of the patient after these medicines showed that the patient stayed the same I have reviewed the patients home medicines and have made adjustments as needed   Social Determinants of Health:  None   Test / Admission - Considered:  None   Patient with intrauterine pregnancy and threatened abortion.  She will follow-up with OB/GYN and return sooner if significant bleeding or pain        Final Clinical Impression(s) / ED Diagnoses Final diagnoses:  None    Rx / DC Orders ED Discharge Orders     None         Bethann Berkshire, MD 05/08/23 1220
